# Patient Record
Sex: Male | Born: 1955 | Race: White | Hispanic: No | Marital: Married | Smoking: Former smoker
Health system: Southern US, Community
[De-identification: ages and names within clinical notes are randomized; demographics above are authoritative.]

## PROBLEM LIST (undated history)

## (undated) DIAGNOSIS — I10 Essential (primary) hypertension: Secondary | ICD-10-CM

## (undated) DIAGNOSIS — Z9289 Personal history of other medical treatment: Secondary | ICD-10-CM

## (undated) DIAGNOSIS — K635 Polyp of colon: Secondary | ICD-10-CM

## (undated) DIAGNOSIS — K219 Gastro-esophageal reflux disease without esophagitis: Secondary | ICD-10-CM

## (undated) DIAGNOSIS — B1920 Unspecified viral hepatitis C without hepatic coma: Secondary | ICD-10-CM

## (undated) DIAGNOSIS — J45909 Unspecified asthma, uncomplicated: Secondary | ICD-10-CM

## (undated) DIAGNOSIS — C3490 Malignant neoplasm of unspecified part of unspecified bronchus or lung: Secondary | ICD-10-CM

## (undated) DIAGNOSIS — K5730 Diverticulosis of large intestine without perforation or abscess without bleeding: Secondary | ICD-10-CM

## (undated) HISTORY — DX: Polyp of colon: K63.5

## (undated) HISTORY — DX: Unspecified asthma, uncomplicated: J45.909

## (undated) HISTORY — DX: Essential (primary) hypertension: I10

## (undated) HISTORY — DX: Unspecified viral hepatitis C without hepatic coma: B19.20

## (undated) HISTORY — DX: Personal history of other medical treatment: Z92.89

## (undated) HISTORY — DX: Diverticulosis of large intestine without perforation or abscess without bleeding: K57.30

## (undated) HISTORY — PX: FOREARM SURGERY: SHX651

## (undated) HISTORY — DX: Malignant neoplasm of unspecified part of unspecified bronchus or lung: C34.90

## (undated) HISTORY — DX: Gastro-esophageal reflux disease without esophagitis: K21.9

---

## 2004-12-06 HISTORY — PX: HIP SURGERY: SHX245

## 2006-06-28 ENCOUNTER — Emergency Department (HOSPITAL_COMMUNITY): Admission: EM | Admit: 2006-06-28 | Discharge: 2006-06-28 | Payer: Self-pay | Admitting: Emergency Medicine

## 2006-10-22 ENCOUNTER — Encounter: Admission: RE | Admit: 2006-10-22 | Discharge: 2006-10-22 | Payer: Self-pay | Admitting: *Deleted

## 2006-11-04 ENCOUNTER — Ambulatory Visit: Payer: Self-pay | Admitting: Emergency Medicine

## 2006-11-24 ENCOUNTER — Inpatient Hospital Stay (HOSPITAL_COMMUNITY): Admission: RE | Admit: 2006-11-24 | Discharge: 2006-11-28 | Payer: Self-pay | Admitting: Thoracic Surgery

## 2006-11-24 ENCOUNTER — Encounter (INDEPENDENT_AMBULATORY_CARE_PROVIDER_SITE_OTHER): Payer: Self-pay | Admitting: *Deleted

## 2006-11-24 ENCOUNTER — Ambulatory Visit: Payer: Self-pay | Admitting: Pulmonary Disease

## 2006-11-24 HISTORY — PX: LUNG LOBECTOMY: SHX167

## 2006-11-30 ENCOUNTER — Encounter: Admission: RE | Admit: 2006-11-30 | Discharge: 2006-11-30 | Payer: Self-pay | Admitting: Thoracic Surgery

## 2006-12-14 ENCOUNTER — Encounter: Payer: Self-pay | Admitting: Family Medicine

## 2006-12-14 ENCOUNTER — Encounter: Admission: RE | Admit: 2006-12-14 | Discharge: 2006-12-14 | Payer: Self-pay | Admitting: Thoracic Surgery

## 2007-01-04 ENCOUNTER — Ambulatory Visit: Payer: Self-pay | Admitting: Family Medicine

## 2007-01-04 DIAGNOSIS — Z85118 Personal history of other malignant neoplasm of bronchus and lung: Secondary | ICD-10-CM

## 2007-01-04 DIAGNOSIS — B182 Chronic viral hepatitis C: Secondary | ICD-10-CM | POA: Insufficient documentation

## 2007-01-04 DIAGNOSIS — D126 Benign neoplasm of colon, unspecified: Secondary | ICD-10-CM | POA: Insufficient documentation

## 2007-01-05 ENCOUNTER — Encounter: Payer: Self-pay | Admitting: Family Medicine

## 2007-01-06 LAB — CONVERTED CEMR LAB
ALT: 68 units/L — ABNORMAL HIGH (ref 0–53)
Albumin: 4.4 g/dL (ref 3.5–5.2)
Basophils Absolute: 0.1 10*3/uL (ref 0.0–0.1)
Basophils Relative: 1 % (ref 0–1)
Cholesterol: 158 mg/dL (ref 0–200)
Eosinophils Absolute: 0.3 10*3/uL (ref 0.0–0.7)
HCT: 45.2 % (ref 39.0–52.0)
HCV Ab: POSITIVE — AB
HDL: 39 mg/dL — ABNORMAL LOW (ref >39)
LDL Cholesterol: 105 mg/dL — ABNORMAL HIGH (ref 0–99)
Lymphs Abs: 1.9 10*3/uL (ref 0.7–3.3)
Monocytes Absolute: 0.4 10*3/uL (ref 0.2–0.7)
Monocytes Relative: 8 % (ref 3–11)
Neutrophils Relative %: 50 % (ref 43–77)
RBC: 5.11 M/uL (ref 4.22–5.81)
Sodium: 140 meq/L (ref 135–145)
Total Protein: 7.4 g/dL (ref 6.0–8.3)
Triglycerides: 70 mg/dL (ref <150)

## 2007-01-12 ENCOUNTER — Telehealth: Payer: Self-pay | Admitting: Family Medicine

## 2007-01-20 ENCOUNTER — Ambulatory Visit: Payer: Self-pay | Admitting: Gastroenterology

## 2007-01-27 ENCOUNTER — Telehealth: Payer: Self-pay | Admitting: Family Medicine

## 2007-02-01 ENCOUNTER — Encounter: Admission: RE | Admit: 2007-02-01 | Discharge: 2007-02-01 | Payer: Self-pay | Admitting: Thoracic Surgery

## 2007-02-01 ENCOUNTER — Ambulatory Visit: Payer: Self-pay | Admitting: Thoracic Surgery

## 2007-03-07 ENCOUNTER — Ambulatory Visit: Payer: Self-pay | Admitting: Gastroenterology

## 2007-03-24 ENCOUNTER — Telehealth (INDEPENDENT_AMBULATORY_CARE_PROVIDER_SITE_OTHER): Payer: Self-pay | Admitting: *Deleted

## 2007-04-04 ENCOUNTER — Telehealth: Payer: Self-pay | Admitting: Family Medicine

## 2007-04-05 ENCOUNTER — Ambulatory Visit: Payer: Self-pay | Admitting: Family Medicine

## 2007-04-05 DIAGNOSIS — L659 Nonscarring hair loss, unspecified: Secondary | ICD-10-CM | POA: Insufficient documentation

## 2007-04-26 ENCOUNTER — Ambulatory Visit: Payer: Self-pay | Admitting: Thoracic Surgery

## 2007-04-26 ENCOUNTER — Encounter: Admission: RE | Admit: 2007-04-26 | Discharge: 2007-04-26 | Payer: Self-pay | Admitting: Thoracic Surgery

## 2007-05-04 ENCOUNTER — Ambulatory Visit: Payer: Self-pay | Admitting: Family Medicine

## 2007-05-04 DIAGNOSIS — E291 Testicular hypofunction: Secondary | ICD-10-CM | POA: Insufficient documentation

## 2007-05-05 ENCOUNTER — Telehealth (INDEPENDENT_AMBULATORY_CARE_PROVIDER_SITE_OTHER): Payer: Self-pay | Admitting: *Deleted

## 2007-05-08 LAB — CONVERTED CEMR LAB: Testosterone Free: 672.6 pg/mL — ABNORMAL HIGH (ref 47.0–244.0)

## 2007-05-12 ENCOUNTER — Ambulatory Visit (HOSPITAL_COMMUNITY): Admission: RE | Admit: 2007-05-12 | Discharge: 2007-05-12 | Payer: Self-pay | Admitting: Gastroenterology

## 2007-05-12 ENCOUNTER — Encounter (INDEPENDENT_AMBULATORY_CARE_PROVIDER_SITE_OTHER): Payer: Self-pay | Admitting: Interventional Radiology

## 2007-06-08 ENCOUNTER — Encounter: Payer: Self-pay | Admitting: Family Medicine

## 2007-07-17 ENCOUNTER — Ambulatory Visit: Payer: Self-pay | Admitting: Family Medicine

## 2007-07-17 ENCOUNTER — Telehealth (INDEPENDENT_AMBULATORY_CARE_PROVIDER_SITE_OTHER): Payer: Self-pay | Admitting: *Deleted

## 2007-07-17 ENCOUNTER — Encounter: Admission: RE | Admit: 2007-07-17 | Discharge: 2007-07-17 | Payer: Self-pay | Admitting: Family Medicine

## 2007-07-18 ENCOUNTER — Encounter: Payer: Self-pay | Admitting: Family Medicine

## 2007-07-18 ENCOUNTER — Telehealth (INDEPENDENT_AMBULATORY_CARE_PROVIDER_SITE_OTHER): Payer: Self-pay | Admitting: *Deleted

## 2007-07-18 ENCOUNTER — Telehealth: Payer: Self-pay | Admitting: Family Medicine

## 2007-07-18 ENCOUNTER — Encounter: Admission: RE | Admit: 2007-07-18 | Discharge: 2007-07-18 | Payer: Self-pay | Admitting: Family Medicine

## 2007-07-19 ENCOUNTER — Ambulatory Visit: Payer: Self-pay | Admitting: Thoracic Surgery

## 2007-07-21 ENCOUNTER — Encounter: Payer: Self-pay | Admitting: Family Medicine

## 2007-07-24 ENCOUNTER — Telehealth: Payer: Self-pay | Admitting: Family Medicine

## 2007-07-24 ENCOUNTER — Encounter: Payer: Self-pay | Admitting: Family Medicine

## 2007-08-10 ENCOUNTER — Ambulatory Visit: Payer: Self-pay | Admitting: Gastroenterology

## 2007-08-23 IMAGING — CR DG CHEST 2V
2 series · 2 of 2 positions shown · non-contrast
Comparison: 11/30/06

CLINICAL DATA: Lung lesion.   Post operative lung surgery. 
 CHEST - 2 VIEW:

[view not recorded (1 of 2)]
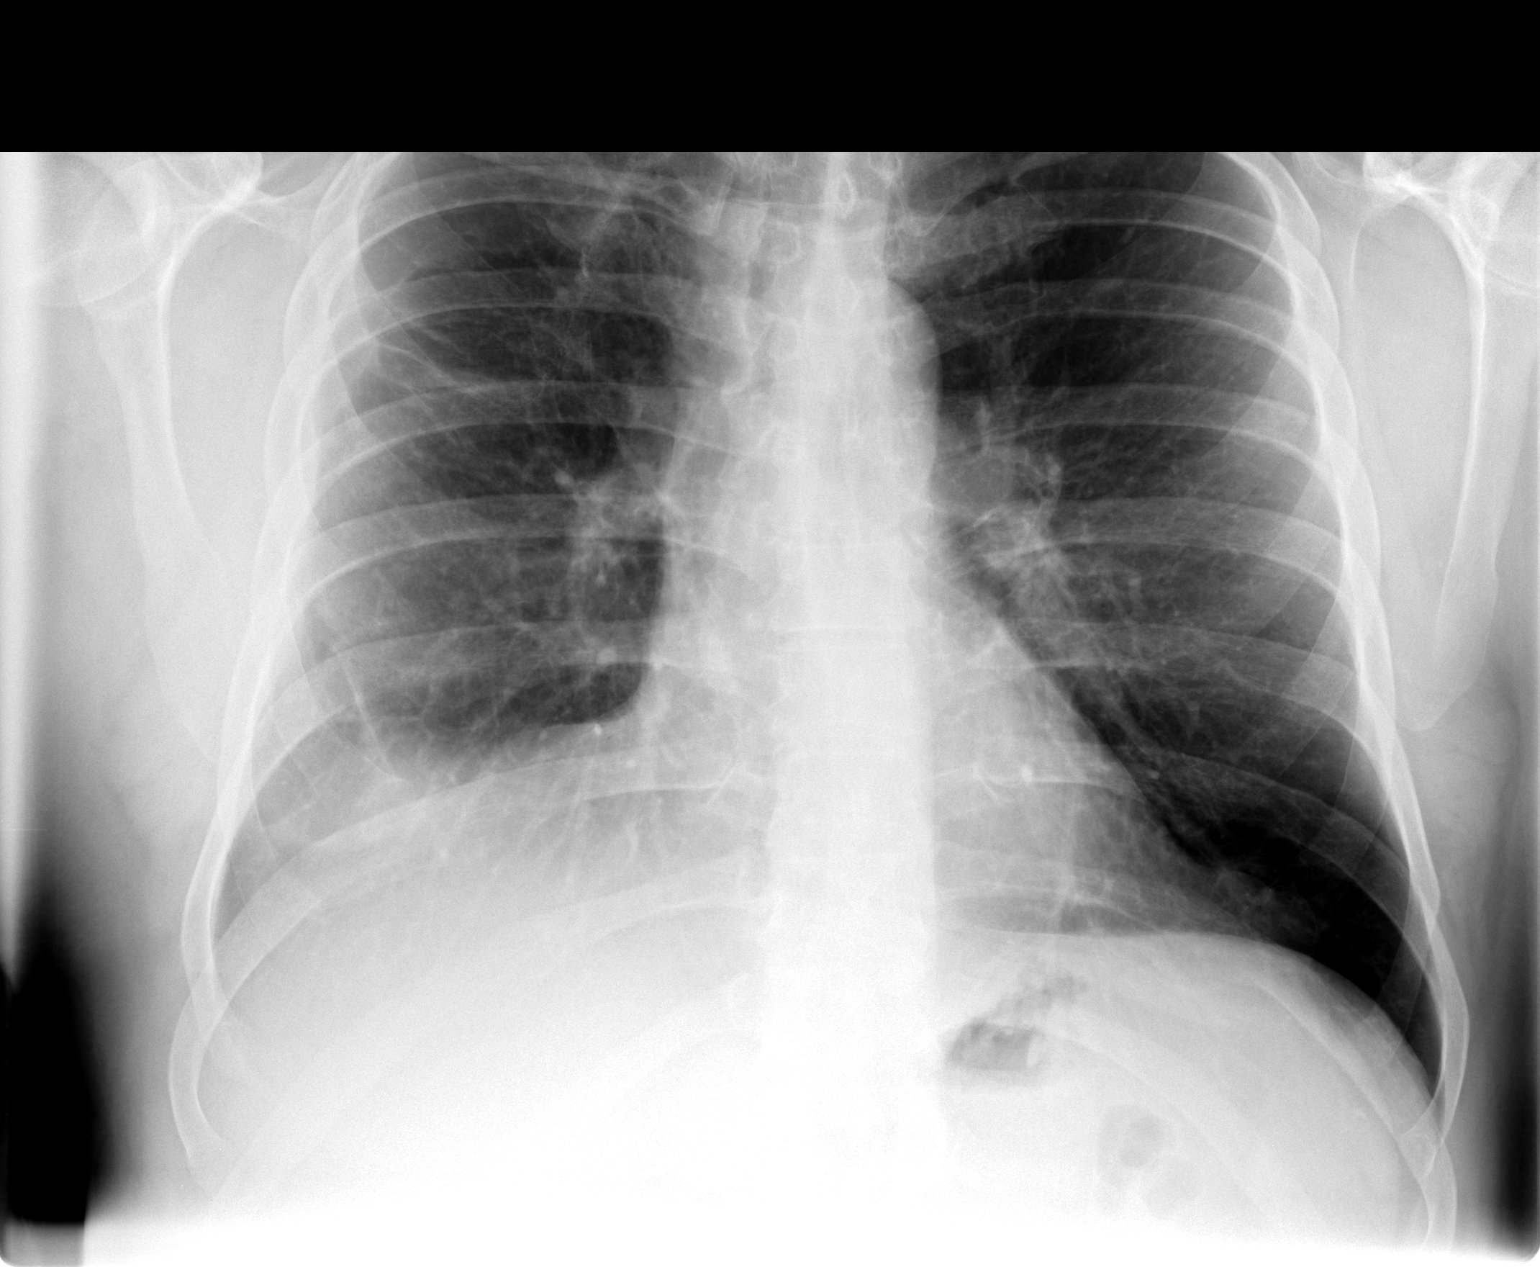

[view not recorded (2 of 2)]
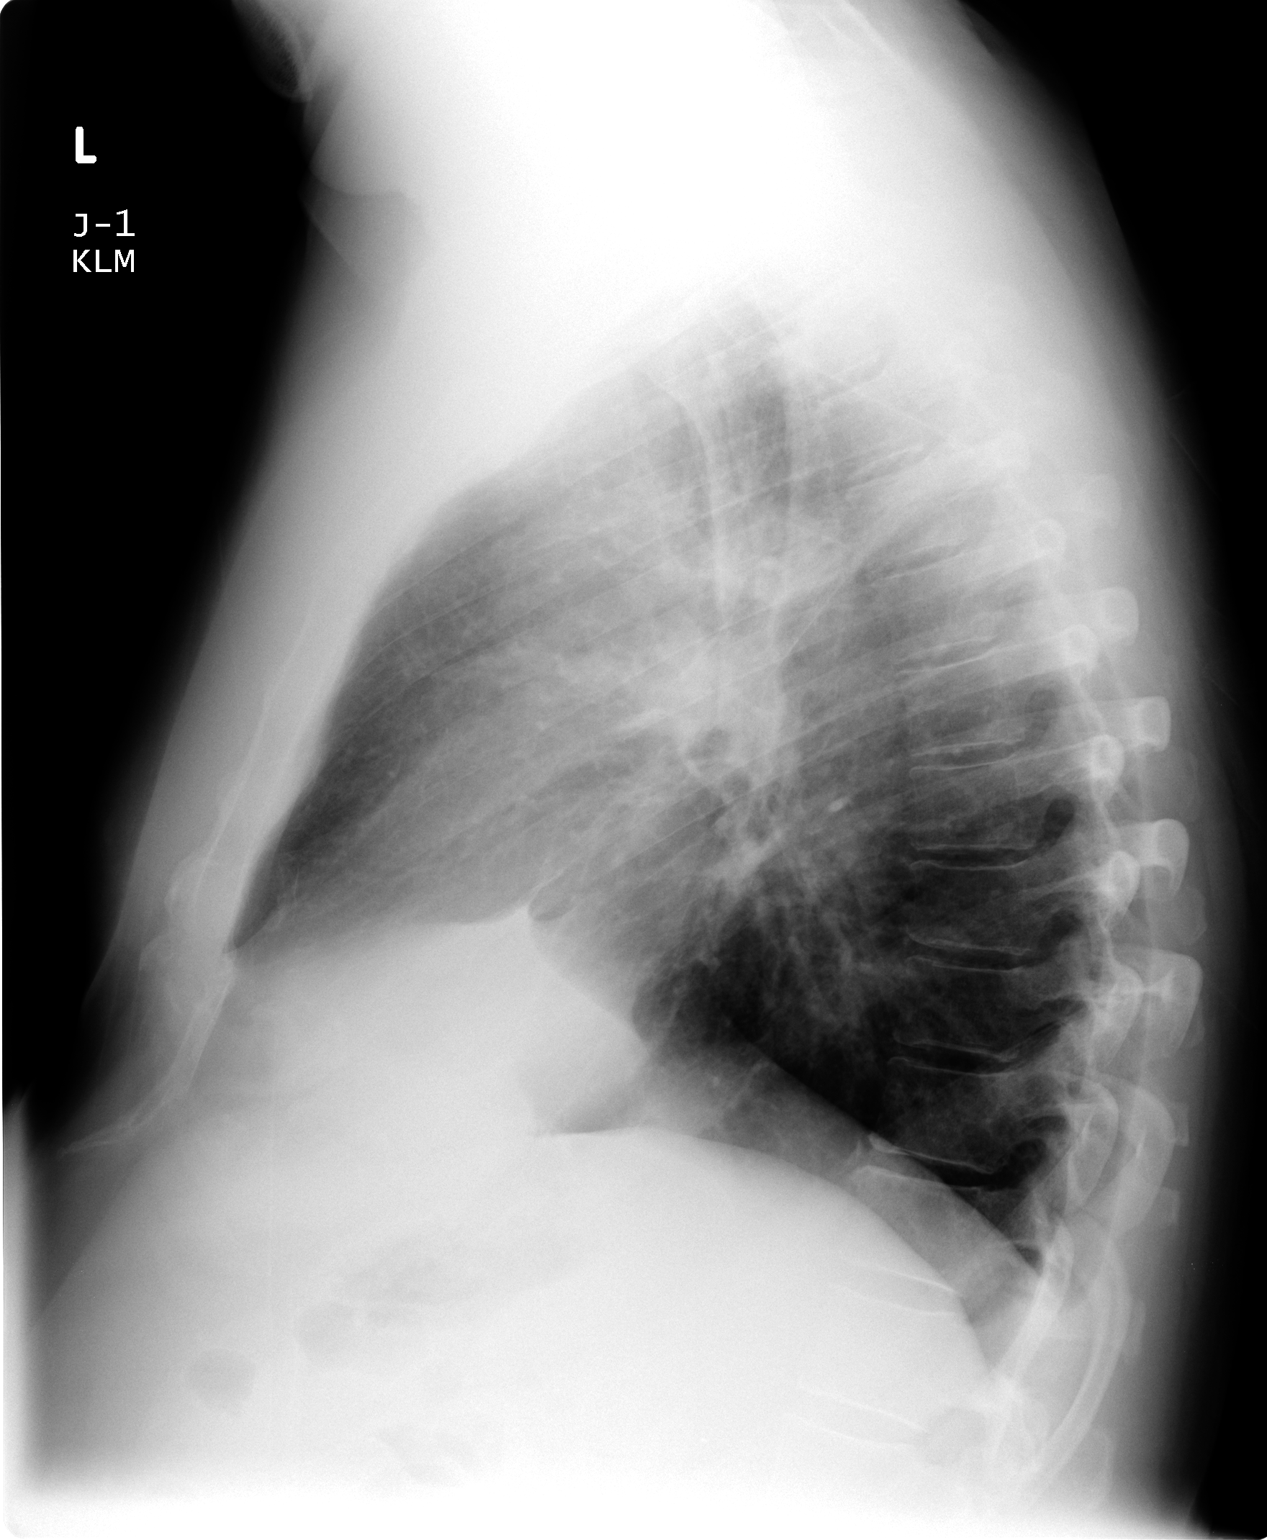

[2 of 2 positions shown; findings below may reference images not displayed]

FINDINGS: Post op changes on the right with pleural and parenchymal scarring on the right, improved from the prior study.  No infiltrate, effusion or mass is identified.
IMPRESSION: Post op change on the right.  No acute abnormality.

## 2007-08-30 ENCOUNTER — Ambulatory Visit: Payer: Self-pay | Admitting: Family Medicine

## 2007-08-30 DIAGNOSIS — S2249XA Multiple fractures of ribs, unspecified side, initial encounter for closed fracture: Secondary | ICD-10-CM

## 2007-09-06 ENCOUNTER — Telehealth: Payer: Self-pay | Admitting: Family Medicine

## 2007-09-26 ENCOUNTER — Ambulatory Visit: Payer: Self-pay | Admitting: Family Medicine

## 2007-10-26 ENCOUNTER — Ambulatory Visit: Payer: Self-pay | Admitting: Thoracic Surgery

## 2007-10-26 ENCOUNTER — Encounter: Admission: RE | Admit: 2007-10-26 | Discharge: 2007-10-26 | Payer: Self-pay | Admitting: Thoracic Surgery

## 2008-01-23 ENCOUNTER — Ambulatory Visit: Payer: Self-pay | Admitting: Gastroenterology

## 2008-04-18 ENCOUNTER — Ambulatory Visit: Payer: Self-pay | Admitting: Family Medicine

## 2008-04-18 DIAGNOSIS — M169 Osteoarthritis of hip, unspecified: Secondary | ICD-10-CM

## 2008-04-18 DIAGNOSIS — F909 Attention-deficit hyperactivity disorder, unspecified type: Secondary | ICD-10-CM | POA: Insufficient documentation

## 2008-04-18 DIAGNOSIS — M1610 Unilateral primary osteoarthritis, unspecified hip: Secondary | ICD-10-CM | POA: Insufficient documentation

## 2008-04-19 ENCOUNTER — Encounter: Payer: Self-pay | Admitting: Family Medicine

## 2008-04-19 LAB — CONVERTED CEMR LAB
ALT: 89 units/L — ABNORMAL HIGH (ref 0–53)
AST: 55 units/L — ABNORMAL HIGH (ref 0–37)
Alkaline Phosphatase: 54 units/L (ref 39–117)
CO2: 23 meq/L (ref 19–32)
Glucose, Bld: 105 mg/dL — ABNORMAL HIGH (ref 70–99)
HDL: 39 mg/dL — ABNORMAL LOW (ref >39)
Total CHOL/HDL Ratio: 4.5
Total Protein: 7.8 g/dL (ref 6.0–8.3)
VLDL: 16 mg/dL (ref 0–40)

## 2008-04-23 ENCOUNTER — Encounter: Payer: Self-pay | Admitting: Family Medicine

## 2008-04-24 ENCOUNTER — Encounter: Payer: Self-pay | Admitting: Family Medicine

## 2008-04-25 ENCOUNTER — Ambulatory Visit: Payer: Self-pay | Admitting: Thoracic Surgery

## 2008-04-25 ENCOUNTER — Encounter: Admission: RE | Admit: 2008-04-25 | Discharge: 2008-04-25 | Payer: Self-pay | Admitting: Thoracic Surgery

## 2008-05-06 ENCOUNTER — Encounter: Payer: Self-pay | Admitting: Family Medicine

## 2008-06-24 ENCOUNTER — Telehealth: Payer: Self-pay | Admitting: Family Medicine

## 2008-07-17 ENCOUNTER — Ambulatory Visit: Payer: Self-pay | Admitting: Family Medicine

## 2008-07-17 ENCOUNTER — Telehealth (INDEPENDENT_AMBULATORY_CARE_PROVIDER_SITE_OTHER): Payer: Self-pay | Admitting: *Deleted

## 2008-07-17 DIAGNOSIS — E348 Other specified endocrine disorders: Secondary | ICD-10-CM | POA: Insufficient documentation

## 2008-07-18 LAB — CONVERTED CEMR LAB
AST: 58 units/L — ABNORMAL HIGH (ref 0–37)
Albumin: 4.6 g/dL (ref 3.5–5.2)
Alkaline Phosphatase: 55 units/L (ref 39–117)
Total Protein: 8.1 g/dL (ref 6.0–8.3)

## 2008-08-19 ENCOUNTER — Telehealth: Payer: Self-pay | Admitting: Family Medicine

## 2008-08-26 ENCOUNTER — Ambulatory Visit: Payer: Self-pay | Admitting: Family Medicine

## 2008-11-22 ENCOUNTER — Ambulatory Visit: Payer: Self-pay | Admitting: Family Medicine

## 2008-11-22 DIAGNOSIS — R030 Elevated blood-pressure reading, without diagnosis of hypertension: Secondary | ICD-10-CM

## 2008-11-25 ENCOUNTER — Telehealth: Payer: Self-pay | Admitting: Family Medicine

## 2008-12-16 ENCOUNTER — Ambulatory Visit: Payer: Self-pay | Admitting: Family Medicine

## 2008-12-17 ENCOUNTER — Telehealth: Payer: Self-pay | Admitting: Family Medicine

## 2008-12-25 ENCOUNTER — Encounter: Payer: Self-pay | Admitting: Family Medicine

## 2008-12-25 ENCOUNTER — Ambulatory Visit: Payer: Self-pay | Admitting: Cardiology

## 2009-01-01 ENCOUNTER — Ambulatory Visit: Payer: Self-pay

## 2009-01-01 ENCOUNTER — Ambulatory Visit: Payer: Self-pay | Admitting: Cardiology

## 2009-01-01 ENCOUNTER — Encounter: Payer: Self-pay | Admitting: Family Medicine

## 2009-01-01 LAB — CONVERTED CEMR LAB
BUN: 26 mg/dL — ABNORMAL HIGH (ref 6–23)
Chloride: 105 meq/L (ref 96–112)
GFR calc Af Amer: 90 mL/min
Potassium: 4.5 meq/L (ref 3.5–5.1)
Sodium: 140 meq/L (ref 135–145)

## 2009-01-31 ENCOUNTER — Telehealth: Payer: Self-pay | Admitting: Family Medicine

## 2009-03-17 ENCOUNTER — Telehealth (INDEPENDENT_AMBULATORY_CARE_PROVIDER_SITE_OTHER): Payer: Self-pay | Admitting: *Deleted

## 2009-03-22 DIAGNOSIS — R0609 Other forms of dyspnea: Secondary | ICD-10-CM

## 2009-03-22 DIAGNOSIS — B1910 Unspecified viral hepatitis B without hepatic coma: Secondary | ICD-10-CM | POA: Insufficient documentation

## 2009-03-22 DIAGNOSIS — F17200 Nicotine dependence, unspecified, uncomplicated: Secondary | ICD-10-CM

## 2009-03-22 DIAGNOSIS — R0989 Other specified symptoms and signs involving the circulatory and respiratory systems: Secondary | ICD-10-CM

## 2009-03-22 DIAGNOSIS — B1710 Acute hepatitis C without hepatic coma: Secondary | ICD-10-CM

## 2009-03-22 DIAGNOSIS — I10 Essential (primary) hypertension: Secondary | ICD-10-CM

## 2009-03-22 DIAGNOSIS — E785 Hyperlipidemia, unspecified: Secondary | ICD-10-CM | POA: Insufficient documentation

## 2009-12-24 ENCOUNTER — Encounter: Payer: Self-pay | Admitting: Cardiology

## 2009-12-24 ENCOUNTER — Ambulatory Visit: Payer: Self-pay | Admitting: Cardiology

## 2009-12-29 ENCOUNTER — Telehealth: Payer: Self-pay | Admitting: Family Medicine

## 2010-01-01 ENCOUNTER — Encounter: Payer: Self-pay | Admitting: Family Medicine

## 2010-01-01 LAB — CONVERTED CEMR LAB
BUN: 33 mg/dL — ABNORMAL HIGH (ref 6–23)
CO2: 22 meq/L (ref 19–32)
Cholesterol: 182 mg/dL (ref 0–200)
Creatinine, Ser: 1.05 mg/dL (ref 0.40–1.50)
Glucose, Bld: 92 mg/dL (ref 70–99)
HCT: 46.6 % (ref 39.0–52.0)
HDL: 34 mg/dL — ABNORMAL LOW (ref >39)
Hemoglobin: 16.1 g/dL (ref 13.0–17.0)
MCHC: 34.5 g/dL (ref 30.0–36.0)
MCV: 82 fL (ref 78.0–100.0)
RBC: 5.68 M/uL (ref 4.22–5.81)
Sodium: 140 meq/L (ref 135–145)
Total Bilirubin: 0.4 mg/dL (ref 0.3–1.2)
Total Protein: 7.4 g/dL (ref 6.0–8.3)
Triglycerides: 108 mg/dL (ref <150)
VLDL: 22 mg/dL (ref 0–40)
WBC: 5.8 10*3/uL (ref 4.0–10.5)

## 2010-01-07 ENCOUNTER — Ambulatory Visit: Payer: Self-pay | Admitting: Family Medicine

## 2010-01-07 DIAGNOSIS — R6882 Decreased libido: Secondary | ICD-10-CM | POA: Insufficient documentation

## 2010-01-07 DIAGNOSIS — R3914 Feeling of incomplete bladder emptying: Secondary | ICD-10-CM

## 2010-01-07 DIAGNOSIS — N401 Enlarged prostate with lower urinary tract symptoms: Secondary | ICD-10-CM | POA: Insufficient documentation

## 2010-01-09 LAB — CONVERTED CEMR LAB: Testosterone: 332.94 ng/dL — ABNORMAL LOW (ref 350–890)

## 2010-01-12 ENCOUNTER — Telehealth: Payer: Self-pay | Admitting: Family Medicine

## 2010-01-12 ENCOUNTER — Ambulatory Visit: Payer: Self-pay | Admitting: Family Medicine

## 2010-01-12 DIAGNOSIS — J209 Acute bronchitis, unspecified: Secondary | ICD-10-CM

## 2010-01-27 ENCOUNTER — Ambulatory Visit: Payer: Self-pay | Admitting: Family Medicine

## 2010-02-03 ENCOUNTER — Telehealth: Payer: Self-pay | Admitting: Family Medicine

## 2010-02-10 ENCOUNTER — Ambulatory Visit: Payer: Self-pay | Admitting: Family Medicine

## 2010-02-10 ENCOUNTER — Encounter: Admission: RE | Admit: 2010-02-10 | Discharge: 2010-02-10 | Payer: Self-pay | Admitting: Thoracic Surgery

## 2010-02-10 ENCOUNTER — Ambulatory Visit: Payer: Self-pay | Admitting: Thoracic Surgery

## 2010-02-23 ENCOUNTER — Telehealth: Payer: Self-pay | Admitting: Cardiology

## 2010-02-24 ENCOUNTER — Ambulatory Visit: Payer: Self-pay | Admitting: Family Medicine

## 2010-02-24 DIAGNOSIS — I951 Orthostatic hypotension: Secondary | ICD-10-CM

## 2010-03-03 ENCOUNTER — Ambulatory Visit: Payer: Self-pay | Admitting: Family Medicine

## 2010-03-03 DIAGNOSIS — R42 Dizziness and giddiness: Secondary | ICD-10-CM

## 2010-03-04 LAB — CONVERTED CEMR LAB
BUN: 21 mg/dL (ref 6–23)
CO2: 18 meq/L — ABNORMAL LOW (ref 19–32)
Chloride: 104 meq/L (ref 96–112)
Creatinine, Ser: 1.13 mg/dL (ref 0.40–1.50)
HCT: 48.7 % (ref 39.0–52.0)
Hemoglobin: 16.2 g/dL (ref 13.0–17.0)
MCV: 86.5 fL (ref 78.0–100.0)
RDW: 14.7 % (ref 11.5–15.5)

## 2010-03-09 ENCOUNTER — Telehealth: Payer: Self-pay | Admitting: Family Medicine

## 2010-03-10 ENCOUNTER — Ambulatory Visit: Payer: Self-pay | Admitting: Family Medicine

## 2010-03-24 ENCOUNTER — Ambulatory Visit: Payer: Self-pay | Admitting: Family Medicine

## 2010-03-24 ENCOUNTER — Telehealth (INDEPENDENT_AMBULATORY_CARE_PROVIDER_SITE_OTHER): Payer: Self-pay | Admitting: *Deleted

## 2010-04-07 ENCOUNTER — Ambulatory Visit: Payer: Self-pay | Admitting: Family Medicine

## 2010-04-07 ENCOUNTER — Telehealth (INDEPENDENT_AMBULATORY_CARE_PROVIDER_SITE_OTHER): Payer: Self-pay | Admitting: *Deleted

## 2010-04-07 DIAGNOSIS — H8110 Benign paroxysmal vertigo, unspecified ear: Secondary | ICD-10-CM | POA: Insufficient documentation

## 2010-04-07 LAB — CONVERTED CEMR LAB
HCT: 51.3 % (ref 39.0–52.0)
PSA: 0.41 ng/mL (ref 0.10–4.00)

## 2010-04-09 ENCOUNTER — Telehealth: Payer: Self-pay | Admitting: Family Medicine

## 2010-04-09 ENCOUNTER — Ambulatory Visit: Payer: Self-pay | Admitting: Family Medicine

## 2010-04-21 ENCOUNTER — Ambulatory Visit: Payer: Self-pay | Admitting: Family Medicine

## 2010-05-05 ENCOUNTER — Ambulatory Visit: Payer: Self-pay | Admitting: Family Medicine

## 2010-05-19 ENCOUNTER — Ambulatory Visit: Payer: Self-pay | Admitting: Family Medicine

## 2010-06-02 ENCOUNTER — Ambulatory Visit: Payer: Self-pay | Admitting: Family Medicine

## 2010-06-16 ENCOUNTER — Ambulatory Visit: Payer: Self-pay | Admitting: Family Medicine

## 2010-06-29 ENCOUNTER — Telehealth (INDEPENDENT_AMBULATORY_CARE_PROVIDER_SITE_OTHER): Payer: Self-pay | Admitting: *Deleted

## 2010-06-30 ENCOUNTER — Ambulatory Visit: Payer: Self-pay | Admitting: Family Medicine

## 2010-06-30 ENCOUNTER — Telehealth (INDEPENDENT_AMBULATORY_CARE_PROVIDER_SITE_OTHER): Payer: Self-pay | Admitting: *Deleted

## 2010-07-01 LAB — CONVERTED CEMR LAB
AST: 39 units/L — ABNORMAL HIGH (ref 0–37)
PSA: 0.49 ng/mL (ref 0.10–4.00)
Testosterone: 594.67 ng/dL (ref 350–890)

## 2010-07-14 ENCOUNTER — Ambulatory Visit: Payer: Self-pay | Admitting: Family Medicine

## 2010-07-29 ENCOUNTER — Ambulatory Visit: Payer: Self-pay | Admitting: Family Medicine

## 2010-08-05 ENCOUNTER — Telehealth (INDEPENDENT_AMBULATORY_CARE_PROVIDER_SITE_OTHER): Payer: Self-pay | Admitting: *Deleted

## 2010-08-05 ENCOUNTER — Telehealth: Payer: Self-pay | Admitting: Family Medicine

## 2010-08-06 ENCOUNTER — Encounter: Payer: Self-pay | Admitting: Family Medicine

## 2010-08-07 ENCOUNTER — Telehealth: Payer: Self-pay | Admitting: Family Medicine

## 2010-08-31 ENCOUNTER — Telehealth: Payer: Self-pay | Admitting: Family Medicine

## 2010-09-04 ENCOUNTER — Telehealth: Payer: Self-pay | Admitting: Family Medicine

## 2010-09-09 ENCOUNTER — Telehealth: Payer: Self-pay | Admitting: Family Medicine

## 2010-09-15 ENCOUNTER — Ambulatory Visit: Payer: Self-pay | Admitting: Family Medicine

## 2010-10-14 ENCOUNTER — Telehealth: Payer: Self-pay | Admitting: Family Medicine

## 2010-12-16 ENCOUNTER — Telehealth: Payer: Self-pay | Admitting: Family Medicine

## 2010-12-16 ENCOUNTER — Encounter: Payer: Self-pay | Admitting: Gastroenterology

## 2010-12-27 ENCOUNTER — Encounter: Payer: Self-pay | Admitting: Thoracic Surgery

## 2011-01-05 NOTE — Assessment & Plan Note (Signed)
Summary: testosterone inj  Nurse Visit   Vitals Entered By: [name] (May 05, 2010 9:26 AM)  Allergies: 1)  ! 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)    Route: IM    Site: LUOQ gluteus    Patient tolerated injection without complications    Given by: [name] (May 05, 2010 9:27 AM)  Orders Added: 1)  Testosterone Cypionat 200mg  ing [J1080] 2)  Admin of Therapeutic Inj  intramuscular or subcutaneous [96372]   Medication Administration  Injection # 1:    Medication: Testosterone Cypionat 200mg  ing    Diagnosis: HYPOGONADISM, MALE (ICD-257.2)    Route: IM    Site: LUOQ gluteus    Patient tolerated injection without complications    Given by: [name] (May 05, 2010 9:27 AM)  Orders Added: 1)  Testosterone Cypionat 200mg  ing [J1080] 2)  Admin of Therapeutic Inj  intramuscular or subcutaneous [[id number]]

## 2011-01-05 NOTE — Assessment & Plan Note (Signed)
Summary: testosterone  Nurse Visit   Vitals Entered By: [name] (July 14, 2010 9:23 AM)  Allergies (verified): 1)  ! 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)    Route: IM    Site: LUOQ gluteus    Exp Date: 10/2012    Lot #: [id number]    Patient tolerated injection without complications    Given by: [name] (July 14, 2010 9:58 AM)  Orders Added: 1)  Testosterone Cypionat 200mg  ing [J1080] 2)  Admin of Therapeutic Inj  intramuscular or subcutaneous [96372]   Medication Administration  Injection # 1:    Medication: Testosterone Cypionat 200mg  ing    Diagnosis: HYPOGONADISM, MALE (ICD-257.2)    Route: IM    Site: LUOQ gluteus    Exp Date: 10/2012    Lot #: [id number]    Patient tolerated injection without complications    Given by: [name] (July 14, 2010 9:58 AM)  Orders Added: 1)  Testosterone Cypionat 200mg  ing [J1080] 2)  Admin of Therapeutic Inj  intramuscular or subcutaneous [[id number]]

## 2011-01-05 NOTE — Progress Notes (Signed)
Summary: FYI  Phone Note Call from Patient   Caller: Spouse Summary of Call: FYI- Wife called just to inform you that they have been having marital problems for quite a while. They are trying to move forward and work things out.  Initial call taken by: [name],  Apr 07, 2010 2:13 PM

## 2011-01-05 NOTE — Progress Notes (Signed)
Summary: Lab order  Phone Note Call from Patient   Caller: Patient Summary of Call: Pt coming in tomorrow for F/U OV. Pt would like to have blood drawn before OV. Please order labs and I will fax to pharm. Initial call taken by: [name],  September 09, 2010 3:41 PM

## 2011-01-05 NOTE — Assessment & Plan Note (Signed)
Summary: TEST. 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.2)    Route: IM    Site: RUOQ gluteus    Patient tolerated injection without complications    Given by: [name] (January 27, 2010 9:15 AM)  Orders Added: 1)  Testosterone Cypionat 200mg  ing [J1080] 2)  Admin of Therapeutic Inj  intramuscular or subcutaneous [96372]   Medication Administration  Injection # 1:    Medication: Testosterone Cypionat 200mg  ing    Diagnosis: HYPOGONADISM, MALE (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

## 2011-01-05 NOTE — Progress Notes (Signed)
Summary: Strattera SE  Phone Note Call from Patient   Caller: Patient Summary of Call: Pt states Strattera made him very sick. He does not want to continue it. He does want to try something else and wanted to know if you have samples of anything for him to try. Please advise. Initial call taken by: [name],  Apr 09, 2010 4:32 PM  Follow-up for Phone Call        I don't have any samples of other meds. Can you be more specific with the SEs.   Will consider a new start of a different med next wk. Follow-up by: [name] DO,  Apr 09, 2010 4:46 PM     Appended Document: Strattera SE He states he had nausea, vomiting, weakness, and dizziness.   Appended Document: Strattera SE Pt can use OTC Pepto Bismol for GI upset with benadryl tonight. Stop Strattera.  i've added it to his allergy list. Have him call me Monday and let me know if he is feeling better.  [name], D.O.  Appended Document: Strattera SE Pt was already feeling better when he came in for nurse visit.

## 2011-01-05 NOTE — Assessment & Plan Note (Signed)
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.2)    Route: IM    Site: LUOQ gluteus    Patient tolerated injection without complications    Given by: [name] (March 10, 2010 9:37 AM)  Orders Added: 1)  Testosterone Cypionat 200mg  ing [J1080] 2)  Admin of Therapeutic Inj  intramuscular or subcutaneous [96372]   Medication Administration  Injection # 1:    Medication: Testosterone Cypionat 200mg  ing    Diagnosis: HYPOGONADISM, MALE (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

## 2011-01-05 NOTE — Progress Notes (Signed)
Summary: Adderall to expensive  Phone Note Call from Patient   Caller: Patient Summary of Call: The generic adderall is still $40/month and he is unable to afford it. Pt would like to know if you have any cheaper alternatives. Please advise. Initial call taken by: [name],  June 30, 2010 12:41 PM  Follow-up for Phone Call        even for short acting generic adderall, the cost is $27 for just 20 pills.  There really is not anything cheaper than that. Follow-up by: [name] DO,  June 30, 2010 12:44 PM  Additional Follow-up for Phone Call Additional follow up Details #1::        Pt will check w/ his out pt pharm Additional Follow-up by: [name],  June 30, 2010 12:58 PM     Appended Document: Adderall to expensive Pt states he called his Rx company and they recommended Pt use [facility] out pt pharm bc it is cheaper but after current Rx is out Pt requests 90 day supply bc it is the cheapest option.   Appended Document: Adderall to expensive OK. Let's be sure that this is working well for him and BP looks good prior to getting a 90 day supply.  [name], D.O.  Appended Document: Adderall to expensive Pt aware

## 2011-01-05 NOTE — Progress Notes (Signed)
    Appended Document:  Additional labs added. [name], D.O.
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
## 2011-01-05 NOTE — Progress Notes (Signed)
Summary: Adderall refill  Phone Note Refill Request   Refills Requested: Medication #1:  ADDERALL XR 10 MG XR24H-CAP 1 capsule by mouth qAM Requesting 90 day Rx bc it is cheaper for Pt  Initial call taken by: [name],  September 04, 2010 3:25 PM    Prescriptions: ADDERALL XR 10 MG XR24H-CAP (AMPHETAMINE-DEXTROAMPHETAMINE) 1 capsule by mouth qAM  #90 x 0   Entered and Authorized by:   [name] DO   Signed by:   [name] DO on 09/04/2010   Method used:   Print then Give to Patient   RxID:   [id number]   Appended Document: Adderall refill Pt called back and stated that he was going to wait to pickup med bc he cant tell that it is even working. I told Pt that I would leave Rx at front desk in case he changed his mind.

## 2011-01-05 NOTE — Progress Notes (Signed)
Summary: Androgel ?  Phone Note Call from Patient   Caller: Patient Summary of Call: Pt wanted to be sure that it's OK for him to take metoprolol and use androgel at the same time.  Initial call taken by: [name],  August 07, 2010 9:50 AM  Follow-up for Phone Call        I confirmed that there is no drug interaction b/w Metoprolol and Androgel. Follow-up by: [name] DO,  August 07, 2010 10:00 AM     Appended Document: Androgel ? LMOM informing Pt of the above
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
## 2011-01-05 NOTE — Progress Notes (Signed)
Summary: Meds  Phone Note Call from Patient Call back at [facility] Phone [phone]   Caller: Patient Call For: [name] DO Summary of Call: Pt calls and states that he has been taking some old Amphetamine Salts that he had they re expired and are not working and wants to know if you would give him an rx for them on the 5mg  dose. Please let pt know Initial call taken by: Kathlene November LPN,  October 14, 2010 9:00 AM    New/Updated Medications: AMPHETAMINE-DEXTROAMPHETAMINE 5 MG TABS (AMPHETAMINE-DEXTROAMPHETAMINE) Take 1 tab by mouth two times a day Prescriptions: AMPHETAMINE-DEXTROAMPHETAMINE 5 MG TABS (AMPHETAMINE-DEXTROAMPHETAMINE) Take 1 tab by mouth two times a day  #60 x 0   Entered and Authorized by:   [name] DO   Signed by:   [name] DO on 10/14/2010   Method used:   Print then Give to Patient   RxID:   [id number]   Appended Document: Meds pt notified. KJ LPN

## 2011-01-05 NOTE — Assessment & Plan Note (Signed)
Summary: f/u ADD/ testosterone   Vital Signs:  Patient profile:   55 year old male Height:      71 inches Weight:      206 pounds BMI:     28.84 O2 Sat:      97 % on Room air Pulse rate:   76 / minute BP sitting:   114 / 71  (left arm) Cuff size:   large  Vitals Entered By: [name] (June 30, 2010 9:51 AM)  O2 Flow:  Room air CC: F/U.    Primary Care Provider:  [name] D.O.  CC:  F/U. Marland Kitchen  History of Present Illness: 55 yo WM presents for f/u ADHD.  He was started on Straterra starting dose pack last visit in May but had to stop it right away becuase it made him nauseated.  He had labs drawn for f/u testosterone.  He is getting injected q 2 wks.  Feels well for about 9 days and then notes that it wears out.  He feels better.  He would like to go back on Adderall for ADD.  In the past, he had to stop it, even though it seemed to help, because of bP elevation.  His BP has done great on Metoprolol.  Current Medications (verified): 1)  Multivitamins  Tabs (Multiple Vitamin) .... Take 1 Tablet By Mouth Once A Day 2)  Ventolin Hfa 108 (90 Base) Mcg/act Aers (Albuterol Sulfate) .... 2 Puffs 4 X A Day For The Next Wk Then Prn 3)  Metoprolol Succinate 25 Mg Xr24h-Tab (Metoprolol Succinate) .... Take 1 Tablet By Mouth Once A Day  Allergies (verified): 1)  ! Blase Mess 2)  Codeine  Past History:  Past Medical History: Reviewed history from 04/07/2010 and no changes required. 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 Spent time in prison, mulitple tattoos.  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  started smoking again and  still smokes cigars.  Denies ETOH.  Exercises.  Review of Systems      See HPI  Physical Exam  General:  alert, well-developed, well-nourished, and well-hydrated.   Head:  normocephalic and atraumatic.   Mouth:  pharynx pink and moist.   Neck:  no masses.   Lungs:  normal respiratory effort, no intercostal retractions, no accessory muscle use, and normal breath sounds.   Heart:  normal rate, regular rhythm, and no murmur.   Neurologic:  no tremor Skin:  color normal.   Psych:  good eye contact, not anxious appearing, and not depressed appearing.     Impression & Recommendations:  Problem # 1:  ADHD (ICD-314.01) Stopped Straterra samples due to nausea. Will retry generic adderall xr --> recheck BP in 2-4 wks (with next testosterone injection).  Problem # 2:  HYPERTENSION (ICD-401.9) Assessment: Improved RFd meds. His updated medication list for this problem includes:    Metoprolol Succinate 25 Mg Xr24h-tab (Metoprolol succinate) .Marland Kitchen... Take 1 tablet by mouth once a day  BP today: 114/71 Prior BP: 133/89 (04/07/2010)  Labs Reviewed: K+: 4.4 (03/03/2010) Creat: : 1.13 (03/03/2010)   Chol: 182 (01/01/2010)   HDL:  34 (01/01/2010)   LDL: 126 (01/01/2010)   TG: 108 (01/01/2010)  Problem # 3:  HYPOGONADISM, MALE (ICD-257.2) Will f/u labs from today - testosterone level, PSA, LFTs.  Watch for rise in LFTs given hx of Hep C. Symptomatically improving on 200 mg q 2 wks.   Orders: Testosterone Cypionat 200mg  ing (J1080) Admin of Therapeutic Inj  intramuscular or subcutaneous (44010)  Complete Medication List: 1)  Multivitamins Tabs (Multiple vitamin) .... Take 1 tablet by mouth once a day 2)  Ventolin Hfa 108 (90 Base) Mcg/act Aers (Albuterol sulfate) .... 2 puffs 4 x a day for the next wk then prn 3)  Metoprolol Succinate 25 Mg  Xr24h-tab (Metoprolol succinate) .... Take 1 tablet by mouth once a day 4)  Adderall Xr 10 Mg Xr24h-cap (Amphetamine-dextroamphetamine) .Marland Kitchen.. 1 capsule by mouth qam  Patient Instructions: 1)  Stay on Metoprolol for BP. 2)  Will call you w/ lab results tomorrow. 3)  Start generic Adderall XR 10 mg every AM (you do not have to take it everyday). 4)  Return in 1 month for next RX pick up and a nurse BP check. Prescriptions: ADDERALL XR 10 MG XR24H-CAP (AMPHETAMINE-DEXTROAMPHETAMINE) 1 capsule by mouth qAM  #30 x 0   Entered and Authorized by:   [name] DO   Signed by:   [name] DO on 06/30/2010   Method used:   Print then Give to Patient   RxID:   [id number] METOPROLOL SUCCINATE 25 MG XR24H-TAB (METOPROLOL SUCCINATE) Take 1 tablet by mouth once a day  #90 x 1   Entered and Authorized by:   [name] DO   Signed by:   [name] DO on 06/30/2010   Method used:   Electronically to        [facility] [phone]* (retail)       [address]       [place], [place]  [address]       Ph: [phone]       Fax: [phone]   RxID:   [id number]    Medication Administration  Injection # 1:    Medication: Testosterone Cypionat 200mg  ing    Diagnosis: HYPOGONADISM, MALE (ICD-257.2)    Route: IM    Site: RUOQ gluteus    Patient tolerated injection without complications    Given by: [name] (June 30, 2010 9:52 AM)  Orders Added: 1)  Testosterone Cypionat 200mg  ing [J1080] 2)  Admin of Therapeutic Inj  intramuscular or subcutaneous [96372] 3)  Est. Patient Level III [70623]

## 2011-01-05 NOTE — Progress Notes (Signed)
Summary: Prior Auth  Phone Note Call from Patient   Caller: Patient Summary of Call: Dr.Bowen       Patient Call Back [phone]  Patient said that the office needs to get a Prior Auth before he can get his Rx...[phone]  and Fax [phone]. patient want a call back when this is done. Initial call taken by: Vanessa [place],  August 05, 2010 12:39 PM  Follow-up for Phone Call        [facility] company- prior [place] initiated and they will fax their decision to our office once reviewed Follow-up by: Kathlene November,  August 06, 2010 9:59 AM  Additional Follow-up for Phone Call Additional follow up Details #1::        Pt notiifed insurance approved androgel and could pick up at pharamcy. kJ LPN Additional Follow-up by: Kathlene November,  August 06, 2010 10:22 AM

## 2011-01-05 NOTE — Medication Information (Signed)
Summary: Approval for Androgel/[employer]  Approval for Androgel/[employer]   Imported By: Lanelle Bal 08/17/2010 09:51:04  _____________________________________________________________________  External Attachment:    Type:   Image     Comment:   External Document

## 2011-01-05 NOTE — Assessment & Plan Note (Signed)
Summary: injection- jr  Nurse Visit   Allergies: 1)  ! 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)    Route: IM    Site: RUOQ gluteus    Exp Date: 10/06/2012    Lot #: Veronda Prude    Mfr: pfizer    Comments: Testosterone 400mg  given IM    Patient tolerated injection without complications    Given by: Kathlene November (May 19, 2010 9:01 AM)  Orders Added: 1)  Testosterone Cypionat 200mg  ing [J1080] 2)  Admin of Therapeutic Inj  intramuscular or subcutaneous [21308]

## 2011-01-05 NOTE — Progress Notes (Signed)
Summary: Refill Androgel  Phone Note Call from Patient   Summary of Call: Pt calls and wants a refill on his Androgel has been using 2 pumps daily and is out. Would like a 90 day supply for it sent to [facility] Initial call taken by: Kathlene November,  August 31, 2010 9:13 AM  Follow-up for Phone Call        Pt would like 90day supply sent to [facility] Follow-up by: [name],  August 31, 2010 9:27 AM  Additional Follow-up for Phone Call Additional follow up Details #1::        labs done in July. #90 day RX sent. He is to [facility] in 3 mos for office visit.   Additional Follow-up by: [name] DO,  August 31, 2010 9:47 AM    Prescriptions: ANDROGEL 1.62% APPLY ONE PUMP PRESS TO EACH UPPER ARM ONCE DAILY  #3 bottles x 0   Entered and Authorized by:   [name] DO   Signed by:   [name] DO on 08/31/2010   Method used:   Printed then faxed to ...       [facility] [phone]* (retail)       [address] [place], [place]  [address]       Ph: [phone]       Fax: [phone]   RxID:   [id number]   Appended Document: Refill Androgel Rx faxed to pharm
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
## 2011-01-05 NOTE — Letter (Signed)
Summary: AUA Questionnaire/[facility]  AUA Questionnaire/[facility]   Imported By: Lanelle Bal 01/13/2010 11:36:53  _____________________________________________________________________  External Attachment:    Type:   Image     Comment:   External Document
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
## 2011-01-05 NOTE — Progress Notes (Signed)
Summary: Requests labs  Phone Note Call from Patient   Caller: Patient Summary of Call: Pt coming in next week for F/U. Pt recently got insurance back and would like to have complete blood work done. If you order, I will fax to lab.  Initial call taken by: [name],  December 29, 2009 11:34 AM  Follow-up for Phone Call        order printed.  have drawn fasting. Follow-up by: [name] DO,  December 29, 2009 11:49 AM  New Problems: SPECIAL SCREENING MALIGNANT NEOPLASM OF PROSTATE (ICD-V76.44)   New Problems: SPECIAL SCREENING MALIGNANT NEOPLASM OF PROSTATE (ICD-V76.44)  Appended Document: Requests labs Pt aware
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
## 2011-01-05 NOTE — Assessment & Plan Note (Signed)
Summary: inj- jr  Nurse Visit   Vitals Entered By: [name] (June 16, 2010 10:19 AM)  Allergies: 1)  ! 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)    Route: IM    Site: LUOQ gluteus    Patient tolerated injection without complications    Given by: [name] (June 16, 2010 10:19 AM)  Orders Added: 1)  Testosterone Cypionat 200mg  ing [J1080] 2)  Admin of Therapeutic Inj  intramuscular or subcutaneous [96372]   Medication Administration  Injection # 1:    Medication: Testosterone Cypionat 200mg  ing    Diagnosis: HYPOGONADISM, MALE (ICD-257.2)    Route: IM    Site: LUOQ gluteus    Patient tolerated injection without complications    Given by: [name] (June 16, 2010 10:19 AM)  Orders Added: 1)  Testosterone Cypionat 200mg  ing [J1080] 2)  Admin of Therapeutic Inj  intramuscular or subcutaneous [[id number]]

## 2011-01-05 NOTE — Letter (Signed)
Summary: Out of Work  [facility]  [address], [address]   [place], [place] [address]   Phone: [phone]  Fax: [phone]    January 12, 2010   Employee:  Ronnie Martin    To Whom It May Concern:   For Medical reasons, please excuse the above named employee from work for the following dates:  Start:   Feb 7th   End:   Feb 13th.  May return sooner if clinically improving.  If you need additional information, please feel free to contact our office.         Sincerely,    [name] DO

## 2011-01-05 NOTE — Progress Notes (Signed)
Summary: pt. with a question  Phone Note Call from Patient   Caller: Patient Call For: [name] DO Summary of Call: Pt. called and states that he is still feeling really dizzy when laying down. Pt. stated that Dr.Tristine Langi mentioned maybe trying a ENT doctor if this did not get better. Pt. is wondering if he needs another appt. with Dr.Tatiyana Foucher or will she go ahead and set him up with ENT? Pt. would like a call back TODAY at #[phone] Initial call taken by: Michaelle Copas,  March 09, 2010 10:14 AM  Follow-up for Phone Call        As stated in my OV note, if still dizzy today would refer to ENT.  Referral put in today for [facility]A.  Let's try to get him in ASAP and send last 2 OV notes. Follow-up by: [name] DO,  March 09, 2010 10:35 AM     Appended Document: pt. with a question Pt aware. Flag sent to [facility]

## 2011-01-05 NOTE — Assessment & Plan Note (Signed)
Summary: INJ  Nurse Visit   Vitals Entered By: [name] (February 10, 2010 9:21 AM)  Allergies: 1)  Codeine  Medication Administration  Injection # 1:    Medication: Testosterone Cypionat 200mg  ing    Diagnosis: HYPOGONADISM, MALE (ICD-257.2)    Route: IM    Site: LUOQ gluteus    Exp Date: 06/05/2012    Lot #: [id number]    Patient tolerated injection without complications    Given by: [name] (February 10, 2010 9:21 AM)  Orders Added: 1)  Testosterone Cypionat 200mg  ing [J1080] 2)  Admin of Therapeutic Inj  intramuscular or subcutaneous [96372]   Medication Administration  Injection # 1:    Medication: Testosterone Cypionat 200mg  ing    Diagnosis: HYPOGONADISM, MALE (ICD-257.2)    Route: IM    Site: LUOQ gluteus    Exp Date: 06/05/2012    Lot #: [id number]    Patient tolerated injection without complications    Given by: [name] (February 10, 2010 9:21 AM)  Orders Added: 1)  Testosterone Cypionat 200mg  ing [J1080] 2)  Admin of Therapeutic Inj  intramuscular or subcutaneous [[id number]]

## 2011-01-05 NOTE — Progress Notes (Signed)
Summary: problem with b/p meds  Phone Note Call from Patient Call back at Home Phone [phone] Call back at cell phone- [phone]   Caller: Patient Reason for Call: Talk to Nurse Summary of Call: Per pt calling having problems with his blood pressure meds.  Initial call taken by: Lorne Skeens,  February 23, 2010 8:58 AM  Follow-up for Phone Call        spoke with pt, he is c/o dizziness when standing. it does not occur everytime he stands but most of the time. he checked his bp at [facility] and it was 106/80 to 125/85. he was also having dizziness when turning his head and the er doctor looked at his ears and thay were fine. he has also seen his medical md recently because of dehydration. pt feels it is his bp meds causing the problems. pt to hold his HCTZ for now, drink more fluids and track his blood pressure and let us know of how he is feeling. Deliah Goody, RN  February 23, 2010 10:46 AM   Additional Follow-up for Phone Call Additional follow up Details #1::        continue off hctz and track BP Ferman Hamming, MD, [facility]  February 24, 2010 11:28 AM
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
## 2011-01-05 NOTE — Assessment & Plan Note (Signed)
Summary: testosterone inj  Nurse Visit   Allergies: 1)  ! 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)    Route: IM    Site: RUOQ gluteus    Exp Date: 10/06/2012    Lot #: [id number]    Comments: testosterone 400mg  given IM    Patient tolerated injection without complications    Given by: Kathlene November (June 02, 2010 9:24 AM)  Orders Added: 1)  Testosterone Cypionat 200mg  ing [J1080] 2)  Admin of Therapeutic Inj  intramuscular or subcutaneous [[id number]]

## 2011-01-05 NOTE — Progress Notes (Signed)
Summary: colonoscopy due in 2012  ---- Converted from flag ---- ---- 01/12/2010 8:47 AM, [name] wrote:   ---- 01/08/2010 9:19 AM, Darral Dash wrote: Per   [facility]   Dr Russella Dar ---pt is not due for Colonoscopy  until 2012   please let Dr Cathey Endow know and get back with me ------------------------------  Appended Document: colonoscopy due in 2012 Pls let pt know that Dr Russella Dar reviewed his record and his colonoscopy is not due until 2012.  Thanks.  [name], D.O.

## 2011-01-05 NOTE — Assessment & Plan Note (Signed)
Summary: testosterone  Nurse Visit   Vitals Entered By: [name] (July 29, 2010 9:34 AM)  Allergies: 1)  ! 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)    Route: IM    Site: LUOQ gluteus    Patient tolerated injection without complications    Given by: [name] (July 29, 2010 9:34 AM)  Orders Added: 1)  Testosterone Cypionat 200mg  ing [J1080] 2)  Admin of Therapeutic Inj  intramuscular or subcutaneous [96372]   Medication Administration  Injection # 1:    Medication: Testosterone Cypionat 200mg  ing    Diagnosis: HYPOGONADISM, MALE (ICD-257.2)    Route: IM    Site: LUOQ gluteus    Patient tolerated injection without complications    Given by: [name] (July 29, 2010 9:34 AM)  Orders Added: 1)  Testosterone Cypionat 200mg  ing [J1080] 2)  Admin of Therapeutic Inj  intramuscular or subcutaneous [[id number]]
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
## 2011-01-05 NOTE — Letter (Signed)
Summary: AUA Questionnaire  AUA Questionnaire   Imported By: Lanelle Bal 09/25/2010 15:36:25  _____________________________________________________________________  External Attachment:    Type:   Image     Comment:   External Document

## 2011-01-05 NOTE — Assessment & Plan Note (Signed)
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.2)    Route: IM    Site: RUOQ gluteus    Patient tolerated injection without complications    Given by: [name] (February 24, 2010 9:27 AM)  Orders Added: 1)  Testosterone Cypionat 200mg  ing [J1080] 2)  Admin of Therapeutic Inj  intramuscular or subcutaneous [96372]   Medication Administration  Injection # 1:    Medication: Testosterone Cypionat 200mg  ing    Diagnosis: HYPOGONADISM, MALE (ICD-257.2)    Route: IM    Site: RUOQ gluteus    Patient tolerated injection without complications    Given by: [name] (February 24, 2010 9:27 AM)  Orders Added: 1)  Testosterone Cypionat 200mg  ing [J1080] 2)  Admin of Therapeutic Inj  intramuscular or subcutaneous [53664]

## 2011-01-05 NOTE — Assessment & Plan Note (Signed)
Summary: NURSE VISIT  Nurse Visit   Vitals Entered By: [name] (March 24, 2010 9:19 AM)  Allergies: 1)  Codeine  Medication Administration  Injection # 1:    Medication: Testosterone Cypionat 200mg  ing    Diagnosis: HYPOGONADISM, MALE (ICD-257.2)    Route: IM    Site: RUOQ gluteus    Exp Date: 03/13    Lot #: [id number]    Patient tolerated injection without complications    Given by: [name] (March 24, 2010 9:19 AM)  Orders Added: 1)  Testosterone Cypionat 200mg  ing [J1080] 2)  Admin of Therapeutic Inj  intramuscular or subcutaneous [96372]   Medication Administration  Injection # 1:    Medication: Testosterone Cypionat 200mg  ing    Diagnosis: HYPOGONADISM, MALE (ICD-257.2)    Route: IM    Site: RUOQ gluteus    Exp Date: 03/13    Lot #: [id number]    Patient tolerated injection without complications    Given by: [name] (March 24, 2010 9:19 AM)  Orders Added: 1)  Testosterone Cypionat 200mg  ing [J1080] 2)  Admin of Therapeutic Inj  intramuscular or subcutaneous [16109]

## 2011-01-05 NOTE — Progress Notes (Signed)
Summary: Xray ?  Phone Note Call from Patient   Caller: Patient Summary of Call: Pt requests to have Xray done here and you to read. Pt doesn't want to go to [facility] for F/U. Please advise.  Initial call taken by: [name],  February 03, 2010 9:40 AM  Follow-up for Phone Call        I spoke directly to Dr Remo Lipps nurse.  She reviewed his notes and his is OVERDUE for a f/u with Dr Edwyna Shell along with chest imaging for hx of LUNG Cancer.    He has to go back to Dr Edwyna Shell for this.  Follow-up by: [name] DO,  February 03, 2010 9:45 AM     Appended Document: Xray ? Pt aware of the above.

## 2011-01-05 NOTE — Consult Note (Signed)
Summary: [facility]  [facility]   Imported By: Lanelle Bal 04/01/2010 08:48:23  _____________________________________________________________________  External Attachment:    Type:   Image     Comment:   External Document

## 2011-01-05 NOTE — Assessment & Plan Note (Signed)
Summary: TEST INJ/  Nurse Visit   Vitals Entered By: [name] (Apr 21, 2010 9:15 AM)  Allergies: 1)  ! 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)    Route: IM    Site: RUOQ gluteus    Comments: 400mg     Patient tolerated injection without complications    Given by: [name] (Apr 21, 2010 9:15 AM)  Orders Added: 1)  Testosterone Cypionat 200mg  ing [J1080] 2)  Admin of Therapeutic Inj  intramuscular or subcutaneous [96372]   Medication Administration  Injection # 1:    Medication: Testosterone Cypionat 200mg  ing    Diagnosis: HYPOGONADISM, MALE (ICD-257.2)    Route: IM    Site: RUOQ gluteus    Comments: 400mg     Patient tolerated injection without complications    Given by: [name] (Apr 21, 2010 9:15 AM)  Orders Added: 1)  Testosterone Cypionat 200mg  ing [J1080] 2)  Admin of Therapeutic Inj  intramuscular or subcutaneous [16109]

## 2011-01-05 NOTE — Progress Notes (Signed)
Summary: Testosterone Rx  Phone Note Call from Patient   Caller: Patient Summary of Call: Pt not satisfied w/ testosterone shots and would like 30 day Rx sent to pharm. Please advise. Initial call taken by: [name],  August 05, 2010 10:38 AM    New/Updated Medications: * ANDROGEL 1.62% APPLY ONE PUMP PRESS TO EACH UPPER ARM ONCE DAILY Prescriptions: ANDROGEL 1.62% APPLY ONE PUMP PRESS TO EACH UPPER ARM ONCE DAILY  #1 BOTTLE x 2   Entered and Authorized by:   [name] DO   Signed by:   [name] DO on 08/05/2010   Method used:   Printed then faxed to ...       [facility] [phone]* (retail)       [address] [place], [place]  [address]       Ph: [phone]       Fax: [phone]   RxID:   [id number]   Appended Document: Testosterone Rx Pt aware of the above

## 2011-01-05 NOTE — Medication Information (Signed)
Summary: Approval for Androgel 1%/[facility]  Approval for Androgel 1%/[facility]   Imported By: Lanelle Bal 08/18/2010 10:37:32  _____________________________________________________________________  External Attachment:    Type:   Image     Comment:   External Document

## 2011-01-05 NOTE — Progress Notes (Signed)
Summary: Bystolic too expensive  Phone Note Call from Patient   Caller: Patient Summary of Call: Pt states Bystolic is too expensive and would like to know if there is a cheaper alternative. Please advise. Initial call taken by: [name],  March 24, 2010 2:55 PM  Follow-up for Phone Call        We can change to metoprolol which is cheaper. Keep f/u with Dr. Cathey Endow as scheduled.  Follow-up by: Nani Gasser MD,  March 24, 2010 4:18 PM    New/Updated Medications: METOPROLOL SUCCINATE 25 MG XR24H-TAB (METOPROLOL SUCCINATE) Take 1 tablet by mouth once a day Prescriptions: METOPROLOL SUCCINATE 25 MG XR24H-TAB (METOPROLOL SUCCINATE) Take 1 tablet by mouth once a day  #30 x 0   Entered and Authorized by:   Nani Gasser MD   Signed by:   Nani Gasser MD on 03/24/2010   Method used:   Electronically to        [facility] [phone]* (retail)       [address]       [place], [place]  [address]       Ph: [phone]       Fax: [phone]   RxID:   [id number]    Pt aware.Arvilla Market CMAMarcelino Duster March 24, 2010 4:23 PM   Appended Document: Bystolic too expensive Lab order put in.  Pls print from [facility].  Needs to be done fasting.  [name], D.O.  Appended Document: Bystolic too expensive faxed

## 2011-01-05 NOTE — Assessment & Plan Note (Signed)
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.2)    Route: IM    Site: LUOQ gluteus    Comments: 400mg     Patient tolerated injection without complications    Given by: [name] (Apr 09, 2010 4:28 PM)  Orders Added: 1)  Testosterone Cypionat 200mg  ing [J1080] 2)  Admin of Therapeutic Inj  intramuscular or subcutaneous [96372]   Medication Administration  Injection # 1:    Medication: Testosterone Cypionat 200mg  ing    Diagnosis: HYPOGONADISM, MALE (ICD-257.2)    Route: IM    Site: LUOQ gluteus    Comments: 400mg     Patient tolerated injection without complications    Given by: [name] (Apr 09, 2010 4:28 PM)  Orders Added: 1)  Testosterone Cypionat 200mg  ing [J1080] 2)  Admin of Therapeutic Inj  intramuscular or subcutaneous [40102]

## 2011-01-07 NOTE — Letter (Signed)
Summary: Colonoscopy Letter  [facility]  [address] [place], [place] [address]   Phone: [phone]  Fax: [phone]      December 16, 2010 MRN: [id number]   LIPA KNAUFF [address] [place], [place]  [address]   Dear Mr. Altemose,   According to your medical record, it is time for you to schedule a Colonoscopy. The American Cancer Society recommends this procedure as a method to detect early colon cancer. Patients with a family history of colon cancer, or a personal history of colon polyps or inflammatory bowel disease are at increased risk.  This letter has been generated based on the recommendations made at the time of your procedure. If you feel that in your particular situation this may no longer apply, please contact our office.  Please call our office at [phone] to schedule this appointment or to update your records at your earliest convenience.  Thank you for cooperating with [place] to provide you with the very best care possible.   Sincerely,  Judie Petit T. Russella Dar, M.D.  [facility] [phone]

## 2011-01-07 NOTE — Progress Notes (Signed)
Summary: Rx refills  Phone Note Refill Request Call back at [facility] Phone [phone]   Refills Requested: Medication #1:  ANDROGEL 1.62% APPLY ONE PUMP PRESS TO EACH UPPER ARM ONCE DAILY.  Medication #2:  METOPROLOL SUCCINATE 25 MG XR24H-TAB Take 1 tablet by mouth once a day Pt requests 90 day supply of meds printed for pickup  Initial call taken by: [name],  December 16, 2010 9:30 AM Caller: Patient Call For: Marcelino Duster Summary of Call: pt calls and request to speak specifically with you. Initial call taken by: Kathlene November LPN,  December 16, 2010 8:31 AM    Prescriptions: METOPROLOL SUCCINATE 25 MG XR24H-TAB (METOPROLOL SUCCINATE) Take 1 tablet by mouth once a day  #90 x 1   Entered and Authorized by:   [name] DO   Signed by:   [name] DO on 12/16/2010   Method used:   Print then Give to Patient   RxID:   [id number] ANDROGEL 1.62% APPLY ONE PUMP PRESS TO EACH UPPER ARM ONCE DAILY  #3 bottles x 0   Entered and Authorized by:   [name] DO   Signed by:   [name] DO on 12/16/2010   Method used:   Print then Give to Patient   RxID:   [id number]

## 2011-01-19 ENCOUNTER — Encounter (INDEPENDENT_AMBULATORY_CARE_PROVIDER_SITE_OTHER): Payer: Self-pay | Admitting: *Deleted

## 2011-01-19 ENCOUNTER — Other Ambulatory Visit: Payer: Self-pay | Admitting: Family Medicine

## 2011-01-19 ENCOUNTER — Encounter (INDEPENDENT_AMBULATORY_CARE_PROVIDER_SITE_OTHER): Payer: Commercial Managed Care - PPO | Admitting: Family Medicine

## 2011-01-19 ENCOUNTER — Encounter: Payer: Self-pay | Admitting: Family Medicine

## 2011-01-19 DIAGNOSIS — Z96649 Presence of unspecified artificial hip joint: Secondary | ICD-10-CM | POA: Insufficient documentation

## 2011-01-19 DIAGNOSIS — Z0000 Encounter for general adult medical examination without abnormal findings: Secondary | ICD-10-CM

## 2011-01-19 DIAGNOSIS — Z85118 Personal history of other malignant neoplasm of bronchus and lung: Secondary | ICD-10-CM

## 2011-01-20 ENCOUNTER — Ambulatory Visit
Admission: RE | Admit: 2011-01-20 | Discharge: 2011-01-20 | Disposition: A | Discharge status: Home / Self Care | Payer: Commercial Managed Care - PPO | Source: Ambulatory Visit | Attending: Family Medicine | Admitting: Family Medicine

## 2011-01-20 ENCOUNTER — Encounter: Payer: Self-pay | Admitting: Family Medicine

## 2011-01-20 DIAGNOSIS — Z85118 Personal history of other malignant neoplasm of bronchus and lung: Secondary | ICD-10-CM

## 2011-01-25 LAB — CONVERTED CEMR LAB
ALT: 97 units/L — ABNORMAL HIGH (ref 0–53)
CO2: 26 meq/L (ref 19–32)
Calcium: 9.1 mg/dL (ref 8.4–10.5)
Chloride: 103 meq/L (ref 96–112)
Cholesterol: 191 mg/dL (ref 0–200)
Creatinine, Ser: 0.96 mg/dL (ref 0.40–1.50)
Glucose, Bld: 98 mg/dL (ref 70–99)
HCT: 50.3 % (ref 39.0–52.0)
PSA: 0.53 ng/mL (ref <=4.00)
Platelets: 200 10*3/uL (ref 150–400)
RBC: 5.66 M/uL (ref 4.22–5.81)
Sodium: 140 meq/L (ref 135–145)
Total Protein: 7.5 g/dL (ref 6.0–8.3)
WBC: 5.3 10*3/uL (ref 4.0–10.5)

## 2011-01-27 ENCOUNTER — Encounter: Payer: Self-pay | Admitting: Family Medicine

## 2011-01-27 NOTE — Letter (Signed)
Summary: Pre Visit Letter Revised  [facility]  [address] [place], [place] [address]   Phone: [phone]  Fax: [phone]        01/19/2011 MRN: [id number] [facility] [address] [place], [place]  [address]  [place]             Procedure Date:  March 02, 2011   recall col-Dr Annamarie Major to the Gastroenterology Division at [facility].    You are scheduled to see a nurse for your pre-procedure visit on February 16, 2011 at 9:30am on the 3rd floor at [facility], [address]  We ask that you try to arrive at our office 15 minutes prior to your appointment time to allow for check-in.  Please take a minute to review the attached form.  If you answer "Yes" to one or more of the questions on the first page, we ask that you call the person listed at your earliest opportunity.  If you answer "No" to all of the questions, please complete the rest of the form and bring it to your appointment.    Your nurse visit will consist of discussing your medical and surgical history, your immediate family medical history, and your medications.   If you are unable to list all of your medications on the form, please bring the medication bottles to your appointment and we will list them.  We will need to be aware of both prescribed and over the counter drugs.  We will need to know exact dosage information as well.    Please be prepared to read and sign documents such as consent forms, a financial agreement, and acknowledgement forms.  If necessary, and with your consent, a friend or relative is welcome to sit-in on the nurse visit with you.  Please bring your insurance card so that we may make a copy of it.  If your insurance requires a referral to see a specialist, please bring your referral form from your primary care physician.  No co-pay is required for this nurse visit.     If you cannot keep your appointment, please call [phone] to cancel or reschedule prior  to your appointment date.  This allows [place] the opportunity to schedule an appointment for another patient in need of care.    Thank you for choosing [facility] for your medical needs.  We appreciate the opportunity to care for you.  Please visit [place] at our website  to learn more about our practice.  Sincerely, The Gastroenterology Division

## 2011-01-27 NOTE — Assessment & Plan Note (Signed)
Summary: CPE   Vital Signs:  Patient profile:   55 year old male Height:      71 inches Weight:      208 pounds BMI:     29.11 O2 Sat:      96 % on Room air Pulse rate:   74 / minute BP sitting:   130 / 86  (left arm) Cuff size:   large  Vitals Entered By: [name] (January 19, 2011 9:00 AM)  O2 Flow:  Room air CC: CPE   Primary Care Provider:  [name] D.O.  CC:  CPE.  History of Present Illness: 55 yo WM presents for CPE.  He is doing well but claims to feel no different on his Androgel.  He is doing well on BP meds, denies CP or DOE.  Occasionally smokes cigars.  He is due for fasting labs, his annual prostate exam.  His colonoscopy, immunizations are UTD.  He had a normal nuclear stress test in 01-2009.  Denies fam hx of premature heart dz, colon cancer or prostate cancer.      Current Medications (verified): 1)  Multivitamins  Tabs (Multiple Vitamin) .... Take 1 Tablet By Mouth Once A Day 2)  Ventolin Hfa 108 (90 Base) Mcg/act Aers (Albuterol Sulfate) .... 2 Puffs 4 X A Day For The Next Wk Then Prn 3)  Metoprolol Succinate 25 Mg Xr24h-Tab (Metoprolol Succinate) .... Take 1 Tablet By Mouth Once A Day 4)  Amphetamine-Dextroamphetamine 5 Mg Tabs (Amphetamine-Dextroamphetamine) .... Take 1 Tab By Mouth Two Times A Day 5)  Androgel 1.62% .... Apply One Pump Press To Each Upper Arm Once Daily  Allergies (verified): 1)  ! Blase Mess 2)  Codeine  Past History:  Past Medical History: Reviewed history from 04/07/2010 and no changes required. 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 F/M healthy sister died of brain aneurysm  Social History: Reviewed history from 05/04/2007 and no changes required. Spent time in prison, mulitple tattoos.  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  started smoking again and  still smokes cigars.  Denies ETOH.  Exercises.  Review of Systems  The patient denies anorexia, fever, weight loss, weight gain, vision loss, decreased hearing, hoarseness, chest pain, syncope, dyspnea on exertion, peripheral edema, prolonged cough, headaches, hemoptysis, abdominal pain, melena, hematochezia, severe indigestion/heartburn, hematuria, incontinence, genital sores, muscle weakness, suspicious skin lesions, transient blindness, difficulty walking, depression, unusual weight change, abnormal bleeding, enlarged lymph nodes, angioedema, breast masses, and testicular masses.    Physical Exam  General:  alert, well-developed, well-nourished, and well-hydrated.   Head:  normocephalic and atraumatic.   Eyes:  pupils equal, pupils round, and pupils reactive to light.   Nose:  no nasal discharge.   Mouth:  good dentition and pharynx pink and moist.   Neck:  no masses.   Lungs:  Normal respiratory effort, chest expands symmetrically. Lungs are clear to auscultation, no crackles or wheezes. Heart:  Normal rate and  regular rhythm. S1 and S2 normal without gallop, murmur, click, rub or other extra sounds. Abdomen:  soft, non-tender, normal bowel sounds, no distention, no masses, no guarding, no hepatomegaly, and no splenomegaly.   Rectal:  No external abnormalities noted. Normal sphincter tone. No rectal masses or tenderness.  hemoccult neg Prostate:  Prostate gland firm and smooth, no enlargement,  nodularity, tenderness, mass, asymmetry or induration. Pulses:  2+ radial and pedal pulses Extremities:  no LE edema, varicosities. Skin:  color normal.  multiple tattooes, solar damage Cervical Nodes:  No lymphadenopathy noted Psych:  good eye contact, not anxious appearing, and not depressed appearing.     Impression & Recommendations:  Problem # 1:  HEALTH SCREENING (ICD-V70.0) Keeping healthy checklist for men reviewed. BP at goal.  BMI 29= overwt.  He is working on [employer] and regular exercise. FAsting lab order printed.  Update testosterone level. DRE done today.  Colonoscopy and stress test are UTD. Immunizations are are UTD. Continue current meds.  Complete Medication List: 1)  Multivitamins Tabs (Multiple vitamin) .... Take 1 tablet by mouth once a day 2)  Ventolin Hfa 108 (90 Base) Mcg/act Aers (Albuterol sulfate) .... 2 puffs 4 x a day for the next wk then prn 3)  Metoprolol Succinate 25 Mg Xr24h-tab (Metoprolol succinate) .... Take 1 tablet by mouth once a day 4)  Amphetamine-dextroamphetamine 5 Mg Tabs (Amphetamine-dextroamphetamine) .... Take 1 tab by mouth two times a day 5)  Androgel 1.62%  .... Apply one pump press to each upper arm once daily  Other Orders: T-CBC No Diff ([id number]) T-Comprehensive Metabolic Panel ([id number]) T-Lipid Profile (34742-59563) T-PSA ([id number]) T-Testosterone; Total [phone]) T- * Misc. Laboratory test [phone]) T-DG Chest 2 View [phone])  Patient Instructions: 1)  Update fasting labs tomorrow. 2)  Will call you w/ results. 3)  Stay on Metoprolol. 4)  I will get you in with ortho for your hip pain.   5)  REturn for f/u low T/ BP in 4 mos.   Orders Added: 1)  T-CBC No Diff [[id number]] 2)  T-Comprehensive Metabolic Panel [[id number]] 3)  T-Lipid Profile [[id number]] 4)  T-PSA [55732-20254] 5)  T-Testosterone; Total [[id number]] 6)  T- * Misc. Laboratory test [99999] 7)  T-DG Chest 2 View [71020] 8)  Est.  Patient age 54-64 [10]  Appended Document: CPE

## 2011-02-11 ENCOUNTER — Encounter: Payer: Self-pay | Admitting: *Deleted

## 2011-02-16 ENCOUNTER — Encounter: Payer: Self-pay | Admitting: Gastroenterology

## 2011-02-23 NOTE — Miscellaneous (Signed)
Summary: [facility]  Clinical Lists Changes  Medications: Added new medication of MOVIPREP 100 GM  SOLR (PEG-KCL-NACL-NASULF-NA ASC-C) As per prep instructions. - Signed Rx of MOVIPREP 100 GM  SOLR (PEG-KCL-NACL-NASULF-NA ASC-C) As per prep instructions.;  #1 x 0;  Signed;  Entered by: Ezra Sites RN;  Authorized by: Meryl Dare MD Clementeen Graham;  Method used: Electronically to [facility]. RX*, [address], [place], [place], [place]  [address], Ph: [phone], Fax: [phone] Observations: Added new observation of ALLERGY REV: Done (02/16/2011 8:57)    Prescriptions: MOVIPREP 100 GM  SOLR (PEG-KCL-NACL-NASULF-NA ASC-C) As per prep instructions.  #1 x 0   Entered by:   Ezra Sites RN   Authorized by:   Meryl Dare MD [facility]   Signed by:   Ezra Sites RN on 02/16/2011   Method used:   Electronically to        [facility]. [facility]* (retail)       [address]       [place], [place]  [address]       Ph: [phone]       Fax: [phone]   RxID:   [id number]

## 2011-02-23 NOTE — Letter (Signed)
Summary: [facility]  [facility]   Imported By: Maryln Gottron 02/16/2011 10:50:51  _____________________________________________________________________  External Attachment:    Type:   Image     Comment:   External Document

## 2011-02-23 NOTE — Letter (Signed)
Summary: [facility] Instructions  [facility]  [address] [place], [place] [address]   Phone: [phone]  Fax: [phone]       Ronnie Martin    1956/07/20    MRN: [id number]        Procedure Day /Date:  Tuesday 03/02/2011     Arrival Time: 9:00 am     Procedure Time: 10:00 am     Location of Procedure:                    _x _  [facility] (4th Floor)   PREPARATION FOR COLONOSCOPY WITH MOVIPREP   Starting 5 days prior to your procedure Thursday 3/22 do not eat nuts, seeds, popcorn, corn, beans, peas,  salads, or any raw vegetables.  Do not take any fiber supplements (e.g. Metamucil, Citrucel, and Benefiber).  THE DAY BEFORE YOUR PROCEDURE         DATE: Monday 3/26  1.  Drink clear liquids the entire day-NO SOLID FOOD  2.  Do not drink anything colored red or purple.  Avoid juices with pulp.  No orange juice.  3.  Drink at least 64 oz. (8 glasses) of fluid/clear liquids during the day to prevent dehydration and help the prep work efficiently.  CLEAR LIQUIDS INCLUDE: Water Jello Ice Popsicles Tea (sugar ok, no milk/cream) Powdered fruit flavored drinks Coffee (sugar ok, no milk/cream) Gatorade Juice: apple, white grape, white cranberry  Lemonade Clear bullion, consomm, broth Carbonated beverages (any kind) Strained chicken noodle soup Hard Candy                             4.  In the morning, mix first dose of MoviPrep solution:    Empty 1 Pouch A and 1 Pouch B into the disposable container    Add lukewarm drinking water to the top line of the container. Mix to dissolve    Refrigerate (mixed solution should be used within 24 hrs)  5.  Begin drinking the prep at 5:00 p.m. The MoviPrep container is divided by 4 marks.   Every 15 minutes drink the solution down to the next mark (approximately 8 oz) until the full liter is complete.   6.  Follow completed prep with 16 oz of clear liquid of your choice (Nothing red or purple).   Continue to drink clear liquids until bedtime.  7.  Before going to bed, mix second dose of MoviPrep solution:    Empty 1 Pouch A and 1 Pouch B into the disposable container    Add lukewarm drinking water to the top line of the container. Mix to dissolve    Refrigerate  THE DAY OF YOUR PROCEDURE      DATE: Tuesday 3/27  Beginning at 5:00 a.m. (5 hours before procedure):         1. Every 15 minutes, drink the solution down to the next mark (approx 8 oz) until the full liter is complete.  2. Follow completed prep with 16 oz. of clear liquid of your choice.    3. You may drink clear liquids until 8:00 am (2 HOURS BEFORE PROCEDURE).   MEDICATION INSTRUCTIONS  Unless otherwise instructed, you should take regular prescription medications with a small sip of water   as early as possible the morning of your procedure.          OTHER INSTRUCTIONS  You will need a responsible adult at least  56 years of age to accompany you and drive you home.   This person must remain in the waiting room during your procedure.  Wear loose fitting clothing that is easily removed.  Leave jewelry and other valuables at home.  However, you may wish to bring a book to read or  an iPod/MP3 player to listen to music as you wait for your procedure to start.  Remove all body piercing jewelry and leave at home.  Total time from sign-in until discharge is approximately 2-3 hours.  You should go home directly after your procedure and rest.  You can resume normal activities the  day after your procedure.  The day of your procedure you should not:   Drive   Make legal decisions   Operate machinery   Drink alcohol   Return to work  You will receive specific instructions about eating, activities and medications before you leave.    The above instructions have been reviewed and explained to me by   Ezra Sites RN  February 16, 2011 9:09 AM     I fully understand and can verbalize these  instructions _____________________________ Date _________

## 2011-03-01 ENCOUNTER — Encounter: Payer: Self-pay | Admitting: Gastroenterology

## 2011-03-02 ENCOUNTER — Encounter: Payer: Self-pay | Admitting: Gastroenterology

## 2011-03-02 ENCOUNTER — Ambulatory Visit (AMBULATORY_SURGERY_CENTER): Payer: Commercial Managed Care - PPO | Admitting: Gastroenterology

## 2011-03-02 VITALS — BP 142/88 | HR 86 | Temp 96.7°F | Resp 18 | Ht 70.5 in | Wt 210.5 lb

## 2011-03-02 DIAGNOSIS — D126 Benign neoplasm of colon, unspecified: Secondary | ICD-10-CM

## 2011-03-02 DIAGNOSIS — Z86010 Personal history of colonic polyps: Secondary | ICD-10-CM

## 2011-03-02 DIAGNOSIS — Z1211 Encounter for screening for malignant neoplasm of colon: Secondary | ICD-10-CM

## 2011-03-02 DIAGNOSIS — K5730 Diverticulosis of large intestine without perforation or abscess without bleeding: Secondary | ICD-10-CM

## 2011-03-02 DIAGNOSIS — K635 Polyp of colon: Secondary | ICD-10-CM

## 2011-03-02 NOTE — Patient Instructions (Addendum)
Multiple Polyps-handout given Diverticulosis-handout given Internal Hemorrhoids-handout given  Repeat Exam in 3 years (2015) Await results of pathology Please eat a diet that is high in fiber-handout given

## 2011-03-02 NOTE — Progress Notes (Signed)
Polyp, diverticulosis, per MD

## 2011-03-03 ENCOUNTER — Telehealth: Payer: Self-pay | Admitting: *Deleted

## 2011-03-03 NOTE — Telephone Encounter (Signed)
Message left to call if needed

## 2011-03-08 ENCOUNTER — Other Ambulatory Visit: Payer: Self-pay | Admitting: Family Medicine

## 2011-03-08 ENCOUNTER — Inpatient Hospital Stay (HOSPITAL_COMMUNITY)
Admission: RE | Admit: 2011-03-08 | Discharge: 2011-03-10 | DRG: 468 | Disposition: A | Discharge status: Home Health | Payer: Commercial Managed Care - PPO | Source: Ambulatory Visit | Attending: Orthopedic Surgery | Admitting: Orthopedic Surgery

## 2011-03-08 ENCOUNTER — Inpatient Hospital Stay (HOSPITAL_COMMUNITY): Payer: Commercial Managed Care - PPO

## 2011-03-08 ENCOUNTER — Encounter: Payer: Self-pay | Admitting: Gastroenterology

## 2011-03-08 DIAGNOSIS — M25459 Effusion, unspecified hip: Secondary | ICD-10-CM | POA: Diagnosis present

## 2011-03-08 DIAGNOSIS — Z79899 Other long term (current) drug therapy: Secondary | ICD-10-CM

## 2011-03-08 DIAGNOSIS — Y831 Surgical operation with implant of artificial internal device as the cause of abnormal reaction of the patient, or of later complication, without mention of misadventure at the time of the procedure: Secondary | ICD-10-CM | POA: Diagnosis present

## 2011-03-08 DIAGNOSIS — Z85118 Personal history of other malignant neoplasm of bronchus and lung: Secondary | ICD-10-CM

## 2011-03-08 DIAGNOSIS — K219 Gastro-esophageal reflux disease without esophagitis: Secondary | ICD-10-CM | POA: Diagnosis present

## 2011-03-08 DIAGNOSIS — R7989 Other specified abnormal findings of blood chemistry: Secondary | ICD-10-CM | POA: Diagnosis present

## 2011-03-08 DIAGNOSIS — Z902 Acquired absence of lung [part of]: Secondary | ICD-10-CM

## 2011-03-08 DIAGNOSIS — I10 Essential (primary) hypertension: Secondary | ICD-10-CM | POA: Diagnosis present

## 2011-03-08 DIAGNOSIS — T8489XA Other specified complication of internal orthopedic prosthetic devices, implants and grafts, initial encounter: Principal | ICD-10-CM | POA: Diagnosis present

## 2011-03-08 DIAGNOSIS — B1920 Unspecified viral hepatitis C without hepatic coma: Secondary | ICD-10-CM | POA: Diagnosis present

## 2011-03-08 DIAGNOSIS — M8560 Other cyst of bone, unspecified site: Secondary | ICD-10-CM | POA: Diagnosis present

## 2011-03-08 DIAGNOSIS — Z96649 Presence of unspecified artificial hip joint: Secondary | ICD-10-CM

## 2011-03-08 DIAGNOSIS — Z7982 Long term (current) use of aspirin: Secondary | ICD-10-CM

## 2011-03-08 DIAGNOSIS — Z8781 Personal history of (healed) traumatic fracture: Secondary | ICD-10-CM

## 2011-03-08 LAB — DIFFERENTIAL
Basophils Absolute: 0 10*3/uL (ref 0.0–0.1)
Eosinophils Relative: 2 % (ref 0–5)
Lymphocytes Relative: 29 % (ref 12–46)
Monocytes Absolute: 0.8 10*3/uL (ref 0.1–1.0)
Monocytes Relative: 10 % (ref 3–12)
Neutro Abs: 4.6 10*3/uL (ref 1.7–7.7)

## 2011-03-08 LAB — SURGICAL PCR SCREEN
MRSA, PCR: NEGATIVE
Staphylococcus aureus: NEGATIVE

## 2011-03-08 LAB — URINALYSIS, ROUTINE W REFLEX MICROSCOPIC
Bilirubin Urine: NEGATIVE
Hgb urine dipstick: NEGATIVE
Ketones, ur: NEGATIVE mg/dL
Nitrite: NEGATIVE
Urobilinogen, UA: 0.2 mg/dL (ref 0.0–1.0)
pH: 7 (ref 5.0–8.0)

## 2011-03-08 LAB — BASIC METABOLIC PANEL
BUN: 23 mg/dL (ref 6–23)
CO2: 26 mEq/L (ref 19–32)
Calcium: 9.4 mg/dL (ref 8.4–10.5)
Creatinine, Ser: 1.02 mg/dL (ref 0.4–1.5)
GFR calc non Af Amer: >60 mL/min (ref >60)
Glucose, Bld: 93 mg/dL (ref 70–99)
Sodium: 138 mEq/L (ref 135–145)

## 2011-03-08 LAB — CBC
HCT: 45.6 % (ref 39.0–52.0)
Hemoglobin: 15.6 g/dL (ref 13.0–17.0)
MCH: 29.5 pg (ref 26.0–34.0)
MCHC: 34.2 g/dL (ref 30.0–36.0)
MCV: 86.2 fL (ref 78.0–100.0)
RDW: 12.7 % (ref 11.5–15.5)

## 2011-03-08 LAB — ABO/RH: ABO/RH(D): A POS

## 2011-03-08 LAB — PROTIME-INR: Prothrombin Time: 13.2 seconds (ref 11.6–15.2)

## 2011-03-09 LAB — BASIC METABOLIC PANEL
CO2: 28 mEq/L (ref 19–32)
Chloride: 105 mEq/L (ref 96–112)
GFR calc Af Amer: >60 mL/min (ref >60)
Sodium: 137 mEq/L (ref 135–145)

## 2011-03-09 LAB — CBC
Hemoglobin: 11.9 g/dL — ABNORMAL LOW (ref 13.0–17.0)
Platelets: 152 10*3/uL (ref 150–400)
RBC: 4.06 MIL/uL — ABNORMAL LOW (ref 4.22–5.81)
WBC: 6.9 10*3/uL (ref 4.0–10.5)

## 2011-03-09 NOTE — Procedures (Signed)
Summary: Colonoscopy  Patient: [name] Note: All result statuses are Final unless otherwise noted.  Tests: (1) Colonoscopy (COL)   COL Colonoscopy           DONE     [facility]     [address]     [place], [place]  [address]          COLONOSCOPY PROCEDURE REPORT          PATIENT:  Ronnie Martin, Ronnie Martin  MR#:  [id number]     BIRTHDATE:  1956/04/13, 54 yrs. old  GENDER:  male     ENDOSCOPIST:  Judie Petit T. Russella Dar, MD, [facility]          PROCEDURE DATE:  03/02/2011     PROCEDURE:  Colonoscopy with biopsy and snare polypectomy     ASA CLASS:  Class II     INDICATIONS:  1) surveillance and high-risk screening  2) history     of pre-cancerous (adenomatous) colon polyps: 12/2005.     MEDICATIONS:   Fentanyl 125 mcg IV, Versed 12 mg IV, Benadryl 25     mg IV     DESCRIPTION OF PROCEDURE:   After the risks benefits and     alternatives of the procedure were thoroughly explained, informed     consent was obtained.  Digital rectal exam was performed and     revealed no abnormalities.   The LB [id number]-[id number] endoscope     was introduced through the anus and advanced to the cecum, which     was identified by both the appendix and ileocecal valve, without     limitations.  The quality of the prep was good, using MoviPrep.     The instrument was then slowly withdrawn as the colon was fully     examined.          <<PROCEDUREIMAGES>>          FINDINGS:  A sessile polyp was found in the ascending colon. It     was 4 mm in size. The polyp was removed using cold biopsy forceps.     A sessile polyp was found in the mid transverse colon. It was 5 mm     in size. Polyp was snared without cautery. Retrieval was     successful. Two polyps were found in the descending colon. They     were 3 - 5 mm in size. Polyps were snared without cautery.     Retrieval was successful. A sessile polyp was found in the sigmoid     colon. It was 6 mm in size. Polyp was snared without cautery.  Retrieval was successful. Moderate diverticulosis was found in the     sigmoid colon.  Otherwise normal colonoscopy without other polyps,     masses, vascular ectasias, or inflammatory changes.  Retroflexed     views in the rectum revealed internal hemorrhoids, small.  The     time to cecum =  7.75  minutes. The scope was then withdrawn (time     =  12.67  min) from the patient and the procedure completed.          COMPLICATIONS:  None          ENDOSCOPIC IMPRESSION:     1) 4 mm sessile polyp in the ascending colon     2) 5 mm sessile polyp in the mid transverse colon     3) 3 - 5 mm Two polyps in the descending colon  4) 6 mm sessile polyp in the sigmoid colon     5) Moderate diverticulosis in the sigmoid colon     6) Internal hemorrhoids          RECOMMENDATIONS:     1) Await pathology results     2) High fiber diet with liberal fluid intake.     3) Repeat Colonoscopy in 3 years if 3 or more polyps are     adenomatous, otherwise 5 years.Judie Petit T. Russella Dar, MD, [facility]          CC:  [name], DO          n.     eSIGNEDVenita Lick. Ladarrell Cornwall at 03/02/2011 10:21 AM          Ludger Nutting, [id number]  Note: An exclamation mark (!) indicates a result that was not dispersed into the flowsheet. Document Creation Date: 03/02/2011 10:22 AM _______________________________________________________________________  (1) Order result status: Final Collection or observation date-time: 03/02/2011 10:14 Requested date-time:  Receipt date-time:  Reported date-time:  Referring Physician:   Ordering Physician: Claudette Head [phone]) Specimen Source:  Source: Launa Grill Order Number: [id number] Lab site:

## 2011-03-10 LAB — CBC
HCT: 34.3 % — ABNORMAL LOW (ref 39.0–52.0)
Hemoglobin: 11.3 g/dL — ABNORMAL LOW (ref 13.0–17.0)
MCH: 29 pg (ref 26.0–34.0)
MCHC: 32.9 g/dL (ref 30.0–36.0)
MCV: 88.2 fL (ref 78.0–100.0)
Platelets: 131 10*3/uL — ABNORMAL LOW (ref 150–400)
RBC: 3.89 MIL/uL — ABNORMAL LOW (ref 4.22–5.81)
RDW: 13 % (ref 11.5–15.5)
WBC: 8.1 10*3/uL (ref 4.0–10.5)

## 2011-03-10 LAB — BASIC METABOLIC PANEL
BUN: 10 mg/dL (ref 6–23)
CO2: 26 mEq/L (ref 19–32)
Calcium: 8.3 mg/dL — ABNORMAL LOW (ref 8.4–10.5)
Chloride: 105 mEq/L (ref 96–112)
Creatinine, Ser: 0.95 mg/dL (ref 0.4–1.5)
GFR calc Af Amer: >60 mL/min (ref >60)
GFR calc non Af Amer: >60 mL/min (ref >60)
Glucose, Bld: 93 mg/dL (ref 70–99)
Potassium: 4 mEq/L (ref 3.5–5.1)
Sodium: 136 mEq/L (ref 135–145)

## 2011-03-11 ENCOUNTER — Telehealth: Payer: Self-pay | Admitting: Gastroenterology

## 2011-03-11 NOTE — Telephone Encounter (Signed)
Received a call from  [place] patient's wife. She is calling because Dr. Ardell Isaacs d/c instructions after procedure said patient would have a 3 year recall for colon if 3 or more polyps are adenomatous. The letter he received states the polyps are adenomatous and the recall is 5 years. Which is correct? Please, speak with Junious Dresser re: this answer.Patient is recovering from hip surgery and she wants to be the one to handle this now. Please, advise.

## 2011-03-11 NOTE — Telephone Encounter (Signed)
Please read carefully the recommendations at the bottom of the 03/02/11 colonoscopy report and the pathology report. 5 year recall is correct as he had 2 adenomatous polyps on pathology report. A 3 year recall would have been recommended if he had 3 or more adenomatous polyps.

## 2011-03-11 NOTE — Telephone Encounter (Signed)
Spoke with patient's wife and gave her Dr. Ardell Isaacs recommendation of 5 year recall and his explanation

## 2011-03-13 NOTE — Op Note (Signed)
NAME:  Ronnie Martin, Ronnie Martin NO.:  [id number]  MEDICAL RECORD NO.:  [id number]           PATIENT TYPE:  I  LOCATION:  0099                         FACILITY:  [facility]  PHYSICIAN:  Madlyn Frankel. Charlann Boxer, M.D.  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  FINDINGS:  The patient was noted to have some pericapsular staining with a joint effusion consistent with metallosis and brownish in hue.  No signs of purulence.  There was some involvement into the gluteus minimus area, but the vascular muscle remained intact and healthy.  There was some extension into the anterior capsule.  Operative findings also included diminished posterior wall thickness, perhaps from index reaming.  There was also a large anterior inferior cyst within the region of the pubis, it was packed with bone graft.  PROCEDURE: 1. Revision of right total hip replacement with allograft bone     grafting. 2. Complete synovectomy and debridement.  COMPONENTS USED:  DePuy revision 56 mm Gription Pinnacle cup, 36 plus 4 AltrX liner, and a 36 plus 8.5 Delta ceramic ball.  SURGEON:  Madlyn Frankel. Charlann Boxer, M.D.  ASSISTANT:  Jaquelyn Bitter. [name], P.A.  ANESTHESIA:  General.  SPECIMEN:  I did send to the lab per requesting attorney, this was femoral head and acetabular shell as well as pericapsular soft tissues.  BLOOD LOSS:  About 700 cc.  COMPLICATIONS:  None apparent.  DRAINS:  One Hemovac.  INDICATIONS FOR PROCEDURE:  [name] is a 55 year old male who had presented to the office for evaluation of his right hip.  He had index right total hip replacement in the form of DePuy ASR components performed about 5 years ago in  [place].  He had some increasing complaints of pain, discomfort, and inability to lay on his right or left side.  He had some mildly elevated labs.  Based on his symptoms and exam findings, I recommended revision surgery. Risks of infection; complication related to metallosis, changes in the body and pericapsular tissues as well as DVT and dislocation were all reviewed and discussed as well as the potential need for revision surgery down the road.  Consent was obtained for the benefit of pain relief.  PROCEDURE IN DETAIL:  The patient was brought to operative theater. Once adequate anesthesia, preoperative antibiotics, Ancef administered, the patient was positioned in the left lateral decubitus position with right side up.  The right lower extremity was then prepped and draped in the sterile fashion.  A time-out was performed identifying the patient, planned procedure and the extremity.  The patient had a previous anterior approach to his hip and we used a more of a lateral approach.  The incision was made based off the proximal trochanter.  Sharp dissection was carried to the iliotibial band and gluteal fascia.  This was incised posteriorly.  Once I removed the short external rotators from the posterior capsule, I  made an L capsulotomy, there was a probable 30 cc to 40 cc of effusion, which was brown stained.  The portion of the case at this point was directed at exposure as well as synovial debridement and capsulotomy based on his brown stained synovial tissue from the metallosis.  Once the hip was exposed, the head was dislocated and the femoral head removed off the trunnion and sent for specimen.  At this point, I was able to elevate the gluteus minimus off the remaining ileum superiorly and placed the trunnion of the femoral component proximally.  Then using Innomed extraction set, we were able to remove the acetabular shell, preserving the remaining bone stock.  I was  able to observe some very thin and almost nonexistent posterior wall perhaps related to initial index surgery reaming.  Following removal of the acetabular shell, I noted a pretty large cyst into the anterior-inferior aspect of the acetabulum in the area of the pubic rami.  I used a curette and debrided this out.  Assessing remaining bone stock indicated the superior ilium intact, anterior wall intact, as well as the pubis and ischium anterior and posterior respectively.  At this point, he was noted to have a 52-mm cup in place before.  I began reaming with this.  There was no way to ream any further into the medial wall.  I expanded my reaming up to just a 54 reamer initially, then trialed with a 55 liner to 55 reamer just to see how the fit was going to be.  Once I was satisfied, I was going to have a decent fixation with the remaining bone stock, a 56 Gription cup was chosen.  I did touch and reamed the acetabulum with a 55 reamer.  I packed the acetabulum at this point with 15 cc of cancellous bone chips focused on the large cystic area in the area of the pubic rami anteriorly.  I reversed reamed this area to pack the bone graft into place.  The final 56 Gription cup was then impacted and seemed to be positioned about 35 to 40 degrees of abduction, was positioned at about 20 degrees of forward flexion beneath the remaining anterior wall.  The initial scratch fit appeared to be adequate and was supported with a single 45-mm screw into the ilium and then with smaller screw just a bit posterior to it with less of any chance of fixation.  A trial 36 plus 4 liner was positioned and placed and the trial reduction carried out.  I initially trialed a 36 plus 5 ball and with this, the combined anteversion was about 45 to 50 degrees.  There was no evidence of impingement, there were a few millimeters of shuck.  Given the lack of impingement throughout the range of motion with  combined anteversion, I went ahead and removed the trial liner and placed a final 36 plus 4 neutral AltrX liner.  At this point, I re-trialed with 36 plus 8.5 ball and found that this reduced the shuck to a couple of millimeters in extension and hip stability remained without evidence of any impingement.  Given this range of motion, I assessed with a final 36 plus 8.5 Delta ceramic ball was impacted on to clean and dry trunnion. I did irrigate out the hip throughout the case again at this point. There was noted to be some corrosive changes on the trunnion, but not significant and not as much as I had seen before.  There was no real pericapsular  tissue to be reapproximated based on the debridement carried out due to metal staining.  I did place a medium Hemovac drain deep.  I reapproximated the iliotibial band and gluteal fascia using #1 Vicryl.  The remainder of the wound was closed with 2-0 Vicryl and running 4-0 Monocryl.  The hip was cleaned, dried and dressed sterilely using Aquacel dressing.  The drain site was dressed separately.  He was then extubated and brought to the recovery room in stable condition, tolerating the procedure well.     Madlyn Frankel Charlann Boxer, M.D.     MDO/MEDQ  D:  03/08/2011  T:  03/09/2011  Job:  [id number]  Electronically Signed by Durene Romans M.D. on 03/13/2011 09:59:57 AM

## 2011-03-13 NOTE — Discharge Summary (Signed)
NAME:  Ronnie Martin, Ronnie Martin              ACCOUNT NO.:  [id number]  MEDICAL RECORD NO.:  [id number]           PATIENT TYPE:  I  LOCATION:  1605                         FACILITY:  [facility]  PHYSICIAN:  Madlyn Frankel. Charlann Boxer, M.D.  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  DISCHARGE DIAGNOSES: 1. Failed right hip replacement status post revision right total hip     replacement on March 08, 2011. 2. Hypertension. 3. Reflux disease. 4. History of hepatitis C. 5. History of lung cancer.  ADMITTING HISTORY:  Ronnie Martin is a 55 year old gentleman with a history of right total hip replacement performed 5 years ago in [place].  He had a DePuy ASR component placed.  He has had persistent and progressive discomfort in his hip since the time of his surgery.  He was seen and evaluated recently, new to this area, regarding this.  He was noted to have significant elevated metal ion levels as well as persistent pain. Workup for infection was negative and leading to diagnosis of metallosis- induced discomfort.  After reviewing with him risks and benefits of proposed intervention, he wished at this point to proceed with hip replacement.  Consent was obtained through the office.  HOSPITAL COURSE:  The patient was admitted for same-day surgery on March 08, 2011.  He underwent revision right total hip arthroplasty.  He was noted at that time to have pretty limited posterior bone stalk and despite having initial good stretch does not seem good cancellous screw fixation and I will have him partial weightbearing.  Postoperatively, he was transferred to the recovery room in orthopedic ward remainder of his hospital stay.  He was seen and evaluated by Physical Therapy with partial weightbearing 50%.  His  Hemovac and Foley removed on day #1.  His postoperative radiographs looked good.  His hematocrit on postop day #2 was 34.3.  He did not require transfusion.  His vital signs remained stable.  By postop day #2, he was ready to go home.  DISCHARGE INSTRUCTIONS:  The patient will be discharged to home with home health physical therapy to work on range of motion, gait training, and strengthening exercises with posterior hip precautions.  He will be partial weightbearing 50% until followup or further directed through the office.  We will see him.  He will return to see Dr. Durene Romans in [facility] [phone] in 2 weeks' time.  His dressing should remain dry.  He should keep his hip wound dry until followup.  Any orthopedic questions can be addressed to our office.  His discharge medications include Colace 100 mg p.o. b.i.d. while on pain medicine for constipation, aspirin 325 mg dose b.i.d. for 30 days followed by again returning to his 81 mg dose; iron sulfate 325 mg 2 to 3 times a day for 2 weeks as tolerable, may be he could stop this earlier; Robaxin 500 mg every 6 hours as needed for muscle spasm and pain,  oxycodone 5 to 10 mg every 4 to 6 as needed for pain, MiraLax 17 g p.o. daily as needed for constipation with pain medicine, Senokot-S 1 tablet as needed for constipation, fish oil over-the-counter as needed daily, metoprolol 25 mg q.a.m., multivitamins p.o. daily, Prilosec 20 mg q.a.m.     Madlyn Frankel Charlann Boxer, M.D.     MDO/MEDQ  D:  03/10/2011  T:  03/11/2011  Job:  [id number]  Electronically Signed by Durene Romans M.D. on 03/13/2011 10:00:02 AM
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
## 2011-03-22 ENCOUNTER — Telehealth: Payer: Self-pay | Admitting: *Deleted

## 2011-03-22 MED ORDER — HYDROXYZINE HCL 50 MG PO TABS: ORAL_TABLET | ORAL | Status: DC

## 2011-03-22 NOTE — Telephone Encounter (Signed)
Pt aware of the above  

## 2011-03-22 NOTE — Telephone Encounter (Signed)
Will fill hydroxyzine for him.  Remember that this may make him sleepy.  Let me know later this wk if it's working.

## 2011-03-22 NOTE — Telephone Encounter (Signed)
See if he can come in at 3:45 today.

## 2011-03-22 NOTE — Telephone Encounter (Signed)
Pt would like hydroxyzine sent to [facility] [address]. Pt will also call surgeon to discuss itching and iron w/ him.

## 2011-03-22 NOTE — Telephone Encounter (Signed)
Pt states he was started on iron after surgery and scalp started burning shortly after taking. Surgeon stopped iron and scalp has stopped burning but now Pt states he has horrible itching all over body. Pt states he has been taking benadryl but is not getting any relief. Pt states he is going crazy due the itch and needs to be seen ASAP. Please advise.

## 2011-04-06 HISTORY — PX: TOTAL HIP REVISION: SHX763
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
## 2011-04-20 NOTE — Assessment & Plan Note (Signed)
OFFICE VISIT   Ronnie Martin, Ronnie Martin  DOB:  1956-07-14                                        February 10, 2010  CHART #:  [id number]   The patient came today.  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  There is no evidence of recurrence on his chest x-ray.  We wanted to get a CT scan but he refused, worried about radiation.  I  explained to him in great detail that he needs at least a CT scan once a  year.  His other problem is he continues to have spasms in his right  chest after working very hard.  We previously treated this with Flexeril  and Ultram.  He says the Flexeril does not help that much, so I switched  him to Robaxin 500 mg 3 times a day as needed.  His blood pressure was  120/82, pulse 100, respirations 18, sats were 97%.  He no longer works  in [employer] and now works as a Water quality scientist at [employer].  I recommend that he come back again in a year with a CT scan this time  rather than a chest x-ray.   Ines Bloomer, M.D.  Electronically Signed   DPB/MEDQ  D:  02/10/2010  T:  02/11/2010  Job:  [id number]
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
## 2011-04-20 NOTE — Assessment & Plan Note (Signed)
OFFICE VISIT   Ronnie Martin, Ronnie Martin  DOB:  January 07, 1956                                        Apr 25, 2008  CHART #:  [id number]   Patient came for followup today.  His CT scan was done, and it showed no  evidence of recurrence of his cancer and just some scarring.   I plan to see him again in six months with a CT scan.  He is now 18  months since his surgery.  He still has some muscle spasms, particularly  when it is very hot.   His blood pressure was 136/100, pulse 92, respirations 18, sats 98%.   Ines Bloomer, M.D.  Electronically Signed   DPB/MEDQ  D:  04/25/2008  T:  04/25/2008  Job:  [id number]

## 2011-04-23 NOTE — Discharge Summary (Signed)
NAMEGORDON, VANDUNK NO.:  [id number]   MEDICAL RECORD NO.:  [id number]          PATIENT TYPE:  INP   LOCATION:  3316                         FACILITY:  [facility]   PHYSICIAN:  Ines Bloomer, M.D. DATE OF BIRTH:  [birth year]   DATE OF ADMISSION:  11/24/2006  DATE OF DISCHARGE:  11/28/2006                               DISCHARGE SUMMARY   ADMISSION DIAGNOSIS:  Right upper lobe lung lesion.   SECONDARY DIAGNOSES:  1. Right upper lobe lung lesion, status post wedge resection.      Pathology showing bronchial alveolar carcinoma, mucinous type stage      T1N0Mx.  2. Postoperative hypoxic respiratory failures secondary to mucus      plugging.  3. History of gastroesophageal reflux disease.  4. History of polysubstance abuse.  5. History of left leg phlebitis 20 years ago.  6. Right hip replacement in February 2007.  7. Left arm plating in 1995.  8. History of hemorrhoids.  9. History of hepatitis C antibodies.   ALLERGIES:  INTOLERANCE TO CODEINE, WHICH CAUSES NAUSEA AND VOMITING.   BRIEF HISTORY:  Mr. Rathbone is a 55 year old Caucasian male who was  evaluated in February 2007 in [place], [place], for right upper lobe  lung lesion.  He had a PET scan that showed a 1.1 SUV.  Bronchoscopy was  negative at that time.  The lesion measured 2.1 cm on CT scan.  A repeat  scan was done in December and the lesion had increased from 2.1 to 2.8  cm in size.  There is a big irregularity and questionable cavitation.  This was thought to be possibly a slow-growing bronchial alveolar cancer  or a granulomatous process.  He quit smoking in December 2005.  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  He was referred to Dr. Algis Downs Karle Plumber for consideration of surgical resection.  Preoperative pulmonary  function test showed an FVC of 4.19 with MVV 103.16.   HOSPITAL COURSE:  Mr. Glosser was electively  admitted to [facility] on November 24, 2006 and underwent a right video-assisted  thoracoscopic surgery with mini thoracotomy for wedge resection of his  right upper lobe lesion.  Postoperatively he was extubated and  neurologically intact and transferred to stepdown unit 3300.  Postoperatively he remained stable but from a pulmonary standpoint had a  lot of chest congestion and coughing and wheezing as of postoperative  day 1.  Subsequently he underwent pulmonary consultation and EzPAP and  nebulizer treatments were ordered.  His wheezing and coughing was felt  secondary to mucus plugging.  BiPAP was also added as needed.  Fortunately these measures were enough to help clear his secretions.  For the next several days, Mr. Borawski made steady progress.  He was  ultimately weaned from supplemental oxygen.  Vital signs remained  stable.  His pathology did show bronchial alveolar cancer as discussed  previously.  Dr. Edwyna Shell will discuss oncology at his followup  appointment.  He was stages 1A.  Mr. Maraj On-Q device for  pain  management, posterior chest tube, were discontinued on postoperative day  2 and his remaining chest tube on postoperative day 3.  His latest chest  x-ray did show a small 5-10% apical pneumothorax following chest tube  removal with a followup chest x-ray ordered within the next 24 hours.  Chest x-ray also reveals a persistent right upper lobe infiltrate.  No  definite effusion was noted.  His white count has remained normal at  7.0.  Progressive pulmonary toilet has been encouraged.  Postoperatively  patient's pain was controlled on morphine PCA.  This has not been  continued and he has been started on Tylox p.r.n.  He is been able to  void following Foley catheter removal.  His other postoperative labs  show white blood count of 7.0, hemoglobin 11.7, hematocrit 34.5,  platelet count 166.  Sodium 136, potassium 4.1, chloride 100, CO2 27,  blood glucose 151,  BUN 8, creatinine 0.8, calcium 9.1.  Respiratory  culture on December 21 has shown rare gram positive rods with rare gram  positive cocci in pairs with finial results still pending.  He has been  on Maxipime IV postoperatively.  Would consider further antibiotics  depending on the final results.  His postoperative liver function tests  were normal other then his AST being elevated at 50.  His total protein  was 6.3 and blood albumin 3.0.  Right lung cultures have shown no yeast  or fungal elements or acid fast bacilli to date.   Mr. Colee continues to make steady progress and his followup chest x-  ray on December 24 shows no increase in size of his small right apical  pneumothorax and it is anticipated that he will be discharged home.   DISCHARGE MEDICATIONS:  1. Tylox 1 to 2 tablets p.o. q.4 hours for pain.  2. Albuterol inhaler 2 puffs inhaled q.4 hours p.r.n.  3. Prilosec p.o. daily p.r.n. per home regimen.   DISCHARGE INSTRUCTIONS:  He may follow a regular diet.  He is to  increase his activity slowly and should avoid driving and heavy lifting  for the next 2 weeks.  He may shower and clean incision gently with soap  and water.  He should call if he develops fever greater then 101, or  redness and drainage from his incision sites, or increased shortness of  breath.  He is to follow up with Dr. Edwyna Shell in [facility] office in 7 to 10  days with a chest x-ray 1 hour before this appointment at [facility].  Dr. Edwyna Shell will discuss oncology followup at that time.      Jerold Coombe, P.A.    ______________________________  Ines Bloomer, M.D.    AWZ/MEDQ  D:  11/27/2006  T:  11/28/2006  Job:  [id number]   cc:   Ines Bloomer, M.D.  Leslye Peer, MD

## 2011-04-23 NOTE — Assessment & Plan Note (Signed)
[facility]                             PULMONARY OFFICE NOTE   VIBHAV, WADDILL                       MRN:          [id number]  DATE:11/04/2006                            DOB:          05/02/1956    REASON FOR CONSULTATION:  This is a self-referral by Mr. Tramell for  abnormal CT scan of the chest and a right upper lobe mass.   BRIEF HISTORY:  Mr. Diffley is a 55 year old man with a history of  tobacco abuse who has moved from [place] to [place].  He was  followed in [place] by Dr. Antoine Primas for a right upper lobe cavitary  mass.  He was originally seen by Dr. Arvid Right in February 2007 at which  time the mass measured 2.8 x 1.4 cm.  He underwent fiberoptic  bronchoscopy with biopsies which was nondiagnostic.  He was at that time  and continues to be asymptomatic.  He is very active.  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  He has had some dyspnea with  heavy exertion.  He also has occasional dry cough that is nonproductive.  He denies any chest discomfort.  He has had no fever, chills or sweats.  He has had approximately 25 pounds of weight gain over the last 2 years.  He does have some occasional GERD symptoms.  A CT scan of the chest  performed in November here in [place] in followup has revealed that  his right upper lobe mass has increased in size compared with February  2007.  It is now 2.8 x 1.4 cm in diameter.  There are no other obvious  lesions or lymphadenopathy.  He presents today to discuss the next steps  for definitive diagnosis and treatment.   PAST MEDICAL HISTORY:  1. History of right ankle fracture with chronic right ankle pain.  2. Hip replacement in February 2007.  3. History of mumps and meningitis as a youth.  4. Hypotestosteronism.  5. Tuberculosis exposure in the fairly recent past - either 1998 or      2000.  He was never treated.  He is unclear as to whether he has      had a positive  PPD.   ALLERGIES:  CODEINE causes nausea.   CURRENT MEDICATIONS:  None.   SOCIAL HISTORY:  The patient is married.  He lives with his wife and has  just moved from [place] to [place].  He works as a Music therapist.  He  is a former smoker with a 40 pack-year total history.  He quit in  December 2005.  He also has stopped drinking alcohol.  He formerly used  cocaine and marijuana and quit in December 2004.   FAMILY HISTORY:  Significant for coronary disease and skin cancer.   REVIEW OF SYSTEMS:  Is as per the HPI.   EXAMINATION:  GENERAL:  This is a fairly-well-appearing gentleman in no  distress on room air.  Weight 214 pounds, temperature 97.9, blood  pressure 132/88, heart rate 75, SPO2 97% on room air.  HEENT:  Benign.  LUNGS:  Clear bilaterally.  There is no wheezing.  HEART:  Has a regular rate and rhythm without  murmur.  ABDOMEN:  Obese, soft, nontender, with positive bowel sounds.  EXTREMITIES:  Have no cyanosis, clubbing or edema.  NEUROLOGIC:  He has grossly nonfocal exam.   STUDIES:  CT scans of the chest performed in February and in November  2007 are as detailed above.  Spirometry was performed in [place] and  is available for review.  This was done in February 2007.  His vital  capacity was 4.19 or 88% of predicted.  His FEV1 was 3.16 or 82% of  predicted.  His ratio was 75%.  PET scan performed in February 2007  showed his patchy nodular lesion in the right upper lobe had only mild  enhancement.   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   PLAN:  I will refer Mr. Tassin to [facility] for excision to obtain a tissue  diagnosis and also hopefully definitive therapy.  I have discussed the  case with Dr. Dewayne Shorter and he agrees that this will need to be  resected, even in absence of PET scan positivity.  We will arrange for  resection after he has a consultation with Dr. Edwyna Shell.      Leslye Peer, MD  Electronically Signed    RSB/MedQ  DD: 12/02/2006  DT: 12/02/2006  Job #: [id number]   cc:   Ines Bloomer, M.D.  Antoine Primas, M.D.

## 2011-04-23 NOTE — H&P (Signed)
NAME:  Ronnie Martin, Ronnie Martin NO.:  [id number]   MEDICAL RECORD NO.:  [id number]           PATIENT TYPE:   LOCATION:                                 FACILITY:   PHYSICIAN:  Ines Bloomer, M.D.      DATE OF BIRTH:   DATE OF ADMISSION:  DATE OF DISCHARGE:                              HISTORY & PHYSICAL   CHIEF COMPLAINT:  Right upper lobe lung mass.   HISTORY OF PRESENT ILLNESS:  This patient was worked up on January 14, 2006 in [place], [place] for a right upper lobe lesion.  He had a PET  scan that was 1.1 SUV.  Bronchoscopy was negative at that time.  The  lesion measured 2.1 cm on CT scan.  There is a repeat scan done in  December and the lesion had increased from 2.1 to 2.8 cm in size.  This  is a big irregularity and questionable cavitation.  This was thought to  be possibly a slow growing bronchial alveolar cancer or a granulomatous  process.  He quit smoking in December 2005.  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   PAST MEDICAL HISTORY:  Significant for reflux.   MEDICATIONS:  He takes Prilosec 20 mg a day and Tums as needed.   ALLERGIES:  CODEINE.   FAMILY HISTORY:  Non-contributory.   SOCIAL HISTORY:  He is married.  His wife is disabled.  He works as a  Music therapist.  He does not smoke, quit smoking in December 2005 as  mentioned.  He does not drink alcohol on a regular basis.   REVIEW OF SYSTEMS:  VITAL SIGNS:  He has had some weight gain.  He is  210 lb.  He is 5'11.  CARDIAC:  He has no angina or atrial  fibrillation.  He does get shortness of breath with exertion.  PULMONARY:  He has no history of bronchitis or asthma.  GI:  He has  reflux.  GU:  No kidney disease, dysuria or frequent urination.  VASCULAR:  No claudication, DVT or TIAs.  NEUROLOGIC:  No headaches,  blackouts or seizures. MUSCULOSKELETAL:  No joint pains or myalgias.  PSYCHIATRIC:  No psychiatric  illnesses.  ENT:  No changes in eyesight or  hearing.  HEMATOLOGICAL:  No problems with bleeding or clotting  disorders or anemia.   PHYSICAL EXAMINATION:  GENERAL:  He is a well-developed, Caucasian male  in no acute distress.  VITAL SIGNS:  His blood pressure is 120/70, pulse 76, respirations 18,  saturations 95%.  HEAD:  Atraumatic.  EYES:  Pupils equal, round, reactive to light and accommodation.  Extraocular movements normal.  EARS:  Tympanic membranes intact.  NOSE:  No septal deviation.  THROAT:  Without lesion.  NECK:  Supple with no thyromegaly.  No carotid bruits.  No  supraventricular axial adenopathy.  CHEST:  Clear to auscultation and percussion.  HEART:  Regular sinus rhythm.  No murmurs.  ABDOMEN:  Soft.  No hepatosplenomegaly.  EXTREMITIES:  Pulses 2+.  No clubbing  or edema.  NEUROLOGICAL:  He is oriented times three.  Sensory and motor intact.  Cranial nerves are intact.   LABORATORY DATA:  Pulmonary function test showed an FVC of 4.19 with  FEV1 of 3.16.   IMPRESSION:  1. Enlarging right upper lobe lesion, rule out cancer, rule out      granulomatous process.  2. Gastroesophageal reflux disease.   PLAN:  Right  VATs wedge resection or possible right upper lobectomy.           ______________________________  Ines Bloomer, M.D.     DPB/MEDQ  D:  11/22/2006  T:  11/23/2006  Job:  [id number]

## 2011-04-23 NOTE — Op Note (Signed)
NAMEEDKER, PUNT NO.:  [id number]   MEDICAL RECORD NO.:  [id number]          PATIENT TYPE:  INP   LOCATION:  3316                         FACILITY:  [facility]   PHYSICIAN:  Ines Bloomer, M.D. DATE OF BIRTH:  11/28/1956   DATE OF PROCEDURE:  11/24/2006  DATE OF DISCHARGE:                               OPERATIVE REPORT   PREOPERATIVE DIAGNOSIS:  Right upper lobe lesion.   POSTOPERATIVE DIAGNOSIS:  Right upper lobe lesion.   OPERATIONS PERFORMED:  Right video-assisted thoracoscopic surgery,  minithoracotomy, wedge resection of right upper lobe lesion.   SURGEON:  Ines Bloomer, M.D.   FIRST ASSISTANT:  Coral Ceo, P.A.-C.   ANESTHESIA:  General anesthesia.   DESCRIPTION OF PROCEDURE:  After percutaneous insertion of all  monitoring lines, the patient underwent general anesthesia and he was  turned to the right lateral thoracoscopy position.  A double-lumen tube  was inserted and the right lung was deflated.  He was prepped and draped  in the usual sterile manner.  Two trocar sites were made in the anterior  and posterior axillary line at the seventh intercostal space.  Two  trocars were inserted.  A 0-degree scope was inserted.  The lesion was  seen in the posterior segment of the right upper lobe.  A 7-cm incision  was made over the fifth intercostal space.  The latissimus was partially  divided.  The serratus was reflected anteriorly.  A small Tuffier was  placed in this space.  The lesion was grasped with a [employer] winged  forceps and then resected with the EZ45 stapler.  One area had to be  oversewn with 3-0 Vicryl.  The lesion was sent for frozen section, and  it was a mucinous lesion, possible well differentiated adenocarcinoma;  margins were negative.  Nodes were sampled from the 10R nodes.  Two  chest tubes were brought in through the trocar sites and tied in place  with 0 silk.  CoSeal was applied at the staple line.  The chest was  closed  with 4 paracostals, drilling through the fifth rib, and #1 Vicryl  in the muscle, and 2-0 Vicryl in the subcutaneous tissue, and Dermabond  for the skin.  The patient returned to the recovery room in stable  condition.           ______________________________  Ines Bloomer, M.D.     DPB/MEDQ  D:  11/24/2006  T:  11/24/2006  Job:  [id number]   cc:   Jovita Gamma, MD

## 2011-04-23 NOTE — Assessment & Plan Note (Signed)
[facility]                         GASTROENTEROLOGY OFFICE NOTE   Ronnie Martin, Ronnie Martin                       MRN:          [id number]  DATE:01/20/2007                            DOB:          07-Apr-1956    PROBLEM:  Follow-up colonoscopy, history of colon polyps.   HISTORY:  Ronnie Martin is a 55 year old white male who recently relocated  to [place] from [place], [place]. He has a history of hepatitis C  which he learned of approximately 2 years ago and has not yet been  treated.  He is in the process of being referred to [facility] Hepatitis C clinic. He also has a  recent diagnosis of lung cancer and underwent a wedge resection November 24, 2006 with Dr. Edwyna Shell. Apparently he did not need any further  therapy.  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  He is  status post right hip replacement in February of 2007, and has a  titanium rod in his left forearm as well.   The patient brought a copy of a post colonoscopy instruction sheet. He  had colonoscopy done December 15, 2005 by a physician in [place]. This  showed that he had one polyp removed and also had diverticulosis. The  polyp was in the hepatic flexure. We have since obtained copies of the  colonoscopy report and the pathology report which shows a fragment of an  early tubular adenoma with very mild dysplastic changes. The polyp was 1  cm in size.   The patient states that he was told to have follow-up colonoscopy in 1  year which is why he presents today.   He has had no GI complaints. Has no problems with abdominal pain,  changes in his bowel habits, melena or hematochezia. He had been having  some periodic heart burn which has been much improved since he has lost  some weight intentionally.   CURRENT MEDICATIONS:  No regular daily medications. He uses a  testosterone cream and an aspirin daily.   ALLERGIES:  CODEINE (CAUSES  NAUSEA).   PAST MEDICAL HISTORY:  As outlined above.   SOCIAL HISTORY:  The patient is married, he does not have any children.  He is employed as a Advice worker. He denies alcohol or drug use. He is a  nonsmoker.   FAMILY HISTORY:  Negative for colon cancer or polyps, otherwise  noncontributory.   REVIEW OF SYSTEMS:  CARDIOVASCULAR - negative for chest pain or anginal  symptoms. PULMONARY - is pertinent for some shortness of breath with  exertion due to his recent lung surgery.  GENITOURINARY - negative for  dysuria, urgency or frequency. GI - negative as above. MUSCULOSKELETAL -  negative.   PHYSICAL EXAMINATION:  GENERAL:  A well-developed white male in no acute  distress. Height is 5 feet, 11 inches. Weight is 213.  HEENT: Normocephalic, atraumatic. Extraocular movements are intact.  PERLA. Sclerae are anicteric.  CARDIOVASCULAR: Regular rate and rhythm with S1 and S2.  PULMONARY: Clear to A&P.  ABDOMEN: Soft and nontender, there is no palpable mass or  hepatosplenomegaly.  No guarding or rebound.  RECTAL: Exam was not done today.  SKIN: The patient does have several tattoos.   IMPRESSION:  51. A 55 year old male with history of adenomatous colon polyp, status      post polypectomy, January, 2007.  2. Hepatitis C, active.  3. 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   PLAN:  1. Will plan follow-up colonoscopy in January of 2012 which will be a      5-year interval. He does not need follow-up colonoscopy at this      time.  2. Proceed with referral to a hepatitis C clinic for treatment. That      apparently is in progress though [facility] [employer].  3. Follow-up with Dr. Russella Dar in the interim on a p.r.n. basis.      Mike Gip, PA-C  Electronically Signed      Venita Lick. Russella Dar, MD, [facility]  Electronically Signed   AE/MedQ  DD: 01/20/2007  DT: 01/20/2007  Job #: [id number]

## 2011-05-11 ENCOUNTER — Ambulatory Visit: Payer: Commercial Managed Care - PPO | Admitting: Family Medicine

## 2011-05-14 ENCOUNTER — Telehealth: Payer: Self-pay | Admitting: *Deleted

## 2011-05-14 DIAGNOSIS — B182 Chronic viral hepatitis C: Secondary | ICD-10-CM

## 2011-05-14 NOTE — Telephone Encounter (Signed)
Pt aware of the above  

## 2011-05-14 NOTE — Telephone Encounter (Signed)
He should be OFF Androgel b/c his liver enzymes were high in FEB.  We are due to recheck his liver enzymes and his testosterone.  Will not need PSA checked since he's been of androgel for the past 4 mos.  Lab order is for liver panel only.  Everything else is UTD. This can be done non fasting.

## 2011-05-14 NOTE — Telephone Encounter (Signed)
Pt called asking if he should have labs done prior to apt and if he needs to be fasting. Pt also would like to know if you are going to do a prostate exam. Please advise.

## 2011-05-27 ENCOUNTER — Telehealth: Payer: Self-pay | Admitting: *Deleted

## 2011-05-27 DIAGNOSIS — E291 Testicular hypofunction: Secondary | ICD-10-CM

## 2011-05-28 ENCOUNTER — Telehealth: Payer: Self-pay | Admitting: Family Medicine

## 2011-05-28 LAB — HEPATIC FUNCTION PANEL
ALT: 63 U/L — ABNORMAL HIGH (ref 0–53)
AST: 43 U/L — ABNORMAL HIGH (ref 0–37)
Alkaline Phosphatase: 52 U/L (ref 39–117)
Bilirubin, Direct: 0.1 mg/dL (ref 0.0–0.3)
Total Bilirubin: 0.5 mg/dL (ref 0.3–1.2)

## 2011-05-28 NOTE — Telephone Encounter (Signed)
Lab ordered.

## 2011-05-28 NOTE — Telephone Encounter (Signed)
Called patient: Liver enzymes look slightly better than last time. Recheck in 4 months.

## 2011-05-31 ENCOUNTER — Encounter: Payer: Self-pay | Admitting: Family Medicine

## 2011-05-31 ENCOUNTER — Ambulatory Visit (INDEPENDENT_AMBULATORY_CARE_PROVIDER_SITE_OTHER): Payer: Commercial Managed Care - PPO | Admitting: Family Medicine

## 2011-05-31 VITALS — BP 134/91 | HR 91 | Ht 70.5 in | Wt 209.0 lb

## 2011-05-31 DIAGNOSIS — E291 Testicular hypofunction: Secondary | ICD-10-CM

## 2011-05-31 NOTE — Progress Notes (Signed)
  Subjective:    Patient ID: Ronnie Martin, male    DOB: Apr 15, 1956, 55 y.o.   MRN: [id number]  HPI 55 yo WM presents for f/u hypogonadism.  He was on Androgel for a few months and was noticing some clinical improvements but unfortunately, we saw a rise in his liver enzymes with an underlying hx of hepatitis C.  He really wants to go back on testosterone replacement.  Denies chest pain, edema, heart palpitations or SOB.    BP 134/91  Pulse 91  Ht 5' 10.5" (1.791 m)  Wt 209 lb (94.802 kg)  BMI 29.56 kg/m2  SpO2 95%    Review of Systems as per HPI    Objective:   Physical Exam  Constitutional: He appears well-developed and well-nourished.  HENT:  Mouth/Throat: Oropharynx is clear and moist.  Cardiovascular: Normal rate and normal heart sounds.   Pulmonary/Chest: Effort normal and breath sounds normal.  Skin: Skin is warm and dry.       No jaundice          Assessment & Plan:

## 2011-05-31 NOTE — Patient Instructions (Addendum)
Referral made to Dr Morrison Old for low T (with elevated liver enzymes).  Will call you w/ testosterone level tomorrow.  Return for f/u BP in 4 mos.

## 2011-05-31 NOTE — Assessment & Plan Note (Signed)
We reviewed his rise in liver enzymes from use of Androgel.  He has underlying Hep C.  He agrees to see Endocrinology to discuss.

## 2011-06-03 ENCOUNTER — Telehealth: Payer: Self-pay | Admitting: Family Medicine

## 2011-06-03 NOTE — Telephone Encounter (Signed)
Pt called and requested to talk with Payton Spark, CMA.  Plan:  Will send a message to Marcelino Duster so she can return the call to the patient. Jarvis Newcomer, LPN Domingo Dimes

## 2011-06-04 ENCOUNTER — Telehealth: Payer: Self-pay | Admitting: Family Medicine

## 2011-06-04 NOTE — Telephone Encounter (Signed)
Pt aware of the above  

## 2011-06-04 NOTE — Telephone Encounter (Signed)
Pls let pt know that his testosterone level is fairly low (as expected off testosterone) at 291.  Will fax this to Dr Morrison Old -- endocrinologist he will see for Low T.  His cobalt level is coming down nicely from 142--> 30.5 now.  Will fax copy to his orthopedist.

## 2011-06-04 NOTE — Telephone Encounter (Signed)
All Pt wanted was lab results. Will work on for him

## 2011-06-13 ENCOUNTER — Telehealth: Payer: Self-pay | Admitting: Family Medicine

## 2011-06-13 NOTE — Telephone Encounter (Signed)
Pls let pt know that I reviewed his notes from Dr Morrison Old and he's beeen given the green light to restart testosterone - either shots or topicals.  Which does he prefer?

## 2011-06-14 NOTE — Telephone Encounter (Signed)
LMOM informing Pt of the above 

## 2011-06-15 ENCOUNTER — Telehealth: Payer: Self-pay | Admitting: Family Medicine

## 2011-06-15 MED ORDER — AMBULATORY NON FORMULARY MEDICATION: Status: DC

## 2011-06-15 MED ORDER — METOPROLOL SUCCINATE ER 25 MG PO TB24: 25.0000 mg | ORAL_TABLET | Freq: Every day | ORAL | Status: DC

## 2011-06-15 NOTE — Telephone Encounter (Signed)
Rx's were faxed to [facility] pharm as ordered. Jarvis Newcomer, LPN Domingo Dimes

## 2011-06-15 NOTE — Telephone Encounter (Signed)
Pt. Called and is returning a call to Payton Spark, CMA.  Pt prefers the testosterone gel and wants a 3 month supply sent to [facility] pharmacy.  [phone]. Pt also requesting Metoprolol 25 mg PO QD in 3 month supply sent to the same pharmacy. Plan:  Routed to [facility], CMA Jarvis Newcomer, LPN Domingo Dimes

## 2011-06-15 NOTE — Telephone Encounter (Signed)
Meds printed.  Fax to his pharmacy.

## 2011-08-11 ENCOUNTER — Other Ambulatory Visit: Payer: Self-pay | Admitting: Thoracic Surgery

## 2011-08-11 DIAGNOSIS — R0602 Shortness of breath: Secondary | ICD-10-CM

## 2011-08-11 DIAGNOSIS — C3410 Malignant neoplasm of upper lobe, unspecified bronchus or lung: Secondary | ICD-10-CM

## 2011-08-17 ENCOUNTER — Encounter: Payer: Self-pay | Admitting: Thoracic Surgery

## 2011-08-17 ENCOUNTER — Ambulatory Visit (INDEPENDENT_AMBULATORY_CARE_PROVIDER_SITE_OTHER): Payer: Commercial Managed Care - PPO | Admitting: Thoracic Surgery

## 2011-08-17 ENCOUNTER — Encounter: Payer: Self-pay | Admitting: *Deleted

## 2011-08-17 ENCOUNTER — Ambulatory Visit
Admission: RE | Admit: 2011-08-17 | Discharge: 2011-08-17 | Disposition: A | Discharge status: Home / Self Care | Payer: Commercial Managed Care - PPO | Source: Ambulatory Visit | Attending: Thoracic Surgery | Admitting: Thoracic Surgery

## 2011-08-17 ENCOUNTER — Ambulatory Visit: Payer: Commercial Managed Care - PPO | Admitting: Thoracic Surgery

## 2011-08-17 VITALS — BP 118/80 | HR 72 | Resp 18 | Ht 71.5 in | Wt 214.0 lb

## 2011-08-17 DIAGNOSIS — C3410 Malignant neoplasm of upper lobe, unspecified bronchus or lung: Secondary | ICD-10-CM

## 2011-08-17 DIAGNOSIS — R0602 Shortness of breath: Secondary | ICD-10-CM

## 2011-08-17 DIAGNOSIS — C3490 Malignant neoplasm of unspecified part of unspecified bronchus or lung: Secondary | ICD-10-CM

## 2011-08-17 NOTE — Progress Notes (Signed)
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 There is no evidence of recurrence on his CT scan although I do not have the official report. He is complaining of some episodic shortness of breath but overall is doing well will be followed by his medical doctor the    Current Outpatient Prescriptions  Medication Sig Dispense Refill  . aspirin 81 MG tablet Take 81 mg by mouth daily.        Marland Kitchen ibuprofen (ADVIL,MOTRIN) 200 MG tablet Take 600 mg by mouth every 6 (six) hours as needed.        . metoprolol succinate (TOPROL-XL) 25 MG 24 hr tablet Take 1 tablet (25 mg total) by mouth daily.  90 tablet  3  . multivitamin (THERAGRAN) per tablet Take 1 tablet by mouth daily.        . Omega-3 Fatty Acids (FISH OIL PO) Take 1 capsule by mouth.        . Omeprazole (PRILOSEC PO) Take by mouth.        . testosterone (ANDROGEL) 50 MG/5GM GEL Place 5 g onto the skin 2 days.           Review of Systems: No change   Physical Exam  Cardiovascular: Normal rate, regular rhythm, normal heart sounds and intact distal pulses.   Pulmonary/Chest: Effort normal and breath sounds normal. No respiratory distress.     Diagnostic Tests: CT scan shows no evidence of recurrence of his cancer.   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   Plan: Followup by PCP

## 2011-08-30 ENCOUNTER — Other Ambulatory Visit: Payer: Self-pay | Admitting: Family Medicine

## 2011-08-30 MED ORDER — METOPROLOL SUCCINATE ER 25 MG PO TB24: 25.0000 mg | ORAL_TABLET | Freq: Every day | ORAL | Status: DC

## 2011-08-30 NOTE — Telephone Encounter (Signed)
Pt calling for refill of his metoprolol 25 mg.  Wants 90 day supplies sent to [facility]. Plan:  Sent over remaining refills for # 90 /2 refills. Jarvis Newcomer, LPN Domingo Dimes

## 2011-09-23 LAB — CBC
HCT: 47.4
MCHC: 33.8
MCV: 84.6
Platelets: 195
RDW: 13.8
WBC: 7.1

## 2011-09-23 LAB — PROTIME-INR: Prothrombin Time: 12.9

## 2012-01-18 ENCOUNTER — Other Ambulatory Visit: Payer: Self-pay | Admitting: Family Medicine

## 2012-01-18 ENCOUNTER — Telehealth: Payer: Self-pay | Admitting: *Deleted

## 2012-01-18 DIAGNOSIS — R899 Unspecified abnormal finding in specimens from other organs, systems and tissues: Secondary | ICD-10-CM

## 2012-01-19 LAB — BASIC METABOLIC PANEL
Calcium: 9.4 mg/dL (ref 8.4–10.5)
Glucose, Bld: 91 mg/dL (ref 70–99)
Potassium: 4.7 mEq/L (ref 3.5–5.3)
Sodium: 140 mEq/L (ref 135–145)

## 2012-01-19 LAB — HEPATIC FUNCTION PANEL
ALT: 102 U/L — ABNORMAL HIGH (ref 0–53)
AST: 68 U/L — ABNORMAL HIGH (ref 0–37)
Albumin: 4.5 g/dL (ref 3.5–5.2)
Alkaline Phosphatase: 63 U/L (ref 39–117)
Indirect Bilirubin: 0.6 mg/dL (ref 0.0–0.9)
Total Protein: 7.5 g/dL (ref 6.0–8.3)

## 2012-01-19 LAB — CBC WITH DIFFERENTIAL/PLATELET
Basophils Absolute: 0 10*3/uL (ref 0.0–0.1)
Basophils Relative: 1 % (ref 0–1)
Eosinophils Absolute: 0.3 10*3/uL (ref 0.0–0.7)
Hemoglobin: 16.1 g/dL (ref 13.0–17.0)
MCH: 30.3 pg (ref 26.0–34.0)
MCHC: 34.8 g/dL (ref 30.0–36.0)
Monocytes Relative: 9 % (ref 3–12)
Neutro Abs: 2.9 10*3/uL (ref 1.7–7.7)
Neutrophils Relative %: 55 % (ref 43–77)
Platelets: 178 10*3/uL (ref 150–400)
RDW: 13.4 % (ref 11.5–15.5)

## 2012-01-25 LAB — COBALT: Cobalt, Plasma: 2.1 mcg/L — ABNORMAL HIGH (ref <1.9)

## 2012-01-26 ENCOUNTER — Ambulatory Visit (INDEPENDENT_AMBULATORY_CARE_PROVIDER_SITE_OTHER): Payer: Commercial Managed Care - PPO | Admitting: Family Medicine

## 2012-01-26 ENCOUNTER — Encounter: Payer: Self-pay | Admitting: Family Medicine

## 2012-01-26 VITALS — BP 121/70 | HR 65 | Wt 207.0 lb

## 2012-01-26 DIAGNOSIS — I10 Essential (primary) hypertension: Secondary | ICD-10-CM

## 2012-01-26 DIAGNOSIS — E663 Overweight: Secondary | ICD-10-CM | POA: Insufficient documentation

## 2012-01-26 DIAGNOSIS — Z23 Encounter for immunization: Secondary | ICD-10-CM

## 2012-01-26 DIAGNOSIS — E291 Testicular hypofunction: Secondary | ICD-10-CM

## 2012-01-26 DIAGNOSIS — B1910 Unspecified viral hepatitis B without hepatic coma: Secondary | ICD-10-CM

## 2012-01-26 DIAGNOSIS — E669 Obesity, unspecified: Secondary | ICD-10-CM

## 2012-01-26 MED ORDER — TESTOSTERONE 20.25 MG/1.25GM (1.62%) TD GEL: 1.0000 | Freq: Every day | TRANSDERMAL | Status: DC

## 2012-01-26 NOTE — Progress Notes (Signed)
  Subjective:    Patient ID: Ronnie Martin, male    DOB: 12-09-1955, 55 y.o.   MRN: [id number]  Hypertension This is a chronic problem. The current episode started more than 1 year ago. The problem is controlled. Pertinent negatives include no blurred vision, chest pain or shortness of breath. There are no associated agents to hypertension. Past treatments include beta blockers. The current treatment provides no improvement. There are no compliance problems.     Also here to review bloodwork.    Hypogonadism-he is currently doing one pump on his gel 3 days per week. Before he was doing 2 pounds daily and his levels were a little too high. He wants to see what they're doing now.  Notes his is getting Hep B series through work. This is hsi second time. Evidenlty he wasn't immune.   Review of Systems  Eyes: Negative for blurred vision.  Respiratory: Negative for shortness of breath.   Cardiovascular: Negative for chest pain.       Objective:   Physical Exam  Constitutional: He is oriented to person, place, and time. He appears well-developed and well-nourished.  HENT:  Head: Normocephalic and atraumatic.  Cardiovascular: Normal rate, regular rhythm and normal heart sounds.        No carotid bruits.   Pulmonary/Chest: Effort normal and breath sounds normal.  Neurological: He is alert and oriented to person, place, and time.  Skin: Skin is warm and dry.  Psychiatric: He has a normal mood and affect. His behavior is normal.          Assessment & Plan:  HTN - Doing well. At goal. F/U in 6 weeks. REviewed labs.   Hypogonadism- Based on labs inc dose to 1 pump daliy and recheck in 6-8 weeks.    Chronic Hep C- Liver enzymes are up from his baseline.  REcheck in 6-8 weeks to see if go back down or stay up. He doen'st drink alcohol and avoid all Tylenol products. Workn on low fat diet. If still elevated then will need [place] to eval for cirrhosis and consider referral to hepatology.

## 2012-02-02 ENCOUNTER — Telehealth: Payer: Self-pay | Admitting: *Deleted

## 2012-02-02 ENCOUNTER — Ambulatory Visit (INDEPENDENT_AMBULATORY_CARE_PROVIDER_SITE_OTHER): Payer: Commercial Managed Care - PPO | Admitting: Family Medicine

## 2012-02-02 ENCOUNTER — Ambulatory Visit
Admission: RE | Admit: 2012-02-02 | Discharge: 2012-02-02 | Disposition: A | Discharge status: Home / Self Care | Payer: Commercial Managed Care - PPO | Source: Ambulatory Visit | Attending: Family Medicine | Admitting: Family Medicine

## 2012-02-02 ENCOUNTER — Encounter: Payer: Self-pay | Admitting: Family Medicine

## 2012-02-02 VITALS — BP 112/73 | HR 72 | Ht 71.0 in | Wt 208.0 lb

## 2012-02-02 DIAGNOSIS — K529 Noninfective gastroenteritis and colitis, unspecified: Secondary | ICD-10-CM

## 2012-02-02 DIAGNOSIS — R0602 Shortness of breath: Secondary | ICD-10-CM

## 2012-02-02 DIAGNOSIS — R0789 Other chest pain: Secondary | ICD-10-CM

## 2012-02-02 DIAGNOSIS — K5289 Other specified noninfective gastroenteritis and colitis: Secondary | ICD-10-CM

## 2012-02-02 NOTE — Telephone Encounter (Signed)
Pt called requesting Xray results. I simply read to him what radiologist stated. Pt would like to know what else could be restricting his breathing or making it hard for him to take deep breaths. Please advise.

## 2012-02-02 NOTE — Progress Notes (Signed)
  Subjective:    Patient ID: Ronnie Martin, male    DOB: 1956/01/20, 55 y.o.   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 Then had vomiting and diarrhea that lasted for 24 hours. Still feels weak but now feels really SOB. No cough.  No longer vomiting. Diarrhea done.  Eating ok now.  No Cough. No fevers. No prior hx of DVt. Takes a baby ASA daily.    Review of Systems     Objective:   Physical Exam  Constitutional: He is oriented to person, place, and time. He appears well-developed and well-nourished.  HENT:  Head: Normocephalic and atraumatic.  Cardiovascular: Normal rate, regular rhythm and normal heart sounds.   Pulmonary/Chest: Effort normal and breath sounds normal.  Neurological: He is alert and oriented to person, place, and time.  Skin: Skin is warm and dry.  Psychiatric: He has a normal mood and affect. His behavior is normal.          Assessment & Plan:  Gastroenteriits - Resolving.  Possibly viral vs food poisoning.   SOB - could be secondary to rib muscle strain secondary to severe vomiting though however like to get a chest x-ray to rule out infection or pneumonia or possibly a chemical pneumonitis from aspiration. His pulse ox and chest exam is normal today which is reassuring. He's also been afebrile. No prior history of blood clots we will hold off on checking a d-dimer if his chest x-ray is normal and left the symptoms do not improve or he suddenly gets worse.

## 2012-02-02 NOTE — Patient Instructions (Signed)
Hydration and rest WE will call you with the chest xray results.

## 2012-02-02 NOTE — Telephone Encounter (Signed)
I still think it is pulled muscles around his ribs from such forceful vomiting. Lets order a d-dimer on him just o be 100% sure it is nothing else. I put in order. Please re-print (sorry hit sign form home) and fax downstairs. Pt to go in AM.

## 2012-02-03 NOTE — Telephone Encounter (Signed)
Pt aware. He is feeling a little better and will decide later if he wants to go to lab for this.

## 2012-02-04 ENCOUNTER — Telehealth: Payer: Self-pay | Admitting: *Deleted

## 2012-02-04 LAB — D-DIMER, QUANTITATIVE: D-Dimer, Quant: 0.28 ug/mL-FEU (ref 0.00–0.48)

## 2012-02-04 LAB — BRAIN NATRIURETIC PEPTIDE: Brain Natriuretic Peptide: 11.4 pg/mL (ref 0.0–100.0)

## 2012-02-04 NOTE — Telephone Encounter (Signed)
I looked at the film and didn't see any rib fractures but If he is worried about a specific rib area then I recommend rib films which will show the area in more detail and can better see hairline fractures than what is seen on plain CXR.

## 2012-02-04 NOTE — Telephone Encounter (Signed)
Pt states that he will call us if he decide to have the additional xrays done.

## 2012-02-04 NOTE — Telephone Encounter (Signed)
Pt wants his xr to be looked at again. States that he feels like he has a knife in his left chest and back when he breathes. States that he is concerned that he might have a broken rib. He went for blood work and xray yesterday. Please advise.

## 2012-02-07 ENCOUNTER — Telehealth: Payer: Self-pay | Admitting: Family Medicine

## 2012-02-07 ENCOUNTER — Ambulatory Visit
Admission: RE | Admit: 2012-02-07 | Discharge: 2012-02-07 | Disposition: A | Discharge status: Home / Self Care | Payer: Commercial Managed Care - PPO | Source: Ambulatory Visit | Attending: Family Medicine | Admitting: Family Medicine

## 2012-02-07 ENCOUNTER — Telehealth: Payer: Self-pay | Admitting: *Deleted

## 2012-02-07 DIAGNOSIS — R0602 Shortness of breath: Secondary | ICD-10-CM

## 2012-02-07 NOTE — Telephone Encounter (Signed)
X-ray was negative for fracture the ribs. If he is still having shortness of breath one week from now and then please let me know. Otherwise I will try to get this through the week to see if he starts to improve. He can certainly use Aleve or ibuprofen or Tylenol for pain relief. He can also apply heat for 10-15 minutes at a time and this may help. Also slow deep breathing techniques can help.

## 2012-02-07 NOTE — Telephone Encounter (Signed)
Xray order for rib entered for shortness of breath and pain

## 2012-02-08 NOTE — Telephone Encounter (Signed)
Wife informed. States pt is taking Tylenol to relieve the pain. States pt went to work today and is applying the heat. Will call back if not better in 1 week.

## 2012-03-07 ENCOUNTER — Telehealth: Payer: Self-pay | Admitting: Family Medicine

## 2012-03-07 MED ORDER — TESTOSTERONE 20.25 MG/1.25GM (1.62%) TD GEL: 2.0000 | Freq: Every day | TRANSDERMAL | Status: DC

## 2012-03-07 NOTE — Telephone Encounter (Signed)
Will change androgel to 2 pump.

## 2012-04-24 ENCOUNTER — Telehealth: Payer: Self-pay | Admitting: *Deleted

## 2012-04-24 NOTE — Telephone Encounter (Signed)
LMOM

## 2012-04-24 NOTE — Telephone Encounter (Signed)
Pt is unsure what he is suppose to f/u with you for. Office note from 2/13 states 6 weeks for HTN which should have been in April. His appt also shows with Dr. Thurmond Butts but states you are his provider. He would also like to know if we can send an rx for prilosec to the pharmacy. Please advise.

## 2012-04-24 NOTE — Telephone Encounter (Signed)
Yes, needs f/u appt with either me or Thurmond Butts, either one.  Ok to refill prilosec.

## 2012-04-25 ENCOUNTER — Other Ambulatory Visit: Payer: Self-pay | Admitting: *Deleted

## 2012-04-25 MED ORDER — OMEPRAZOLE 20 MG PO CPDR: 20.0000 mg | DELAYED_RELEASE_CAPSULE | Freq: Every day | ORAL | Status: DC

## 2012-04-27 ENCOUNTER — Ambulatory Visit (INDEPENDENT_AMBULATORY_CARE_PROVIDER_SITE_OTHER): Payer: Commercial Managed Care - PPO | Admitting: Family Medicine

## 2012-04-27 ENCOUNTER — Ambulatory Visit: Payer: Commercial Managed Care - PPO | Admitting: Family Medicine

## 2012-04-27 ENCOUNTER — Encounter: Payer: Self-pay | Admitting: Family Medicine

## 2012-04-27 VITALS — BP 124/75 | HR 69 | Temp 97.8°F | Ht 71.0 in | Wt 213.0 lb

## 2012-04-27 DIAGNOSIS — E291 Testicular hypofunction: Secondary | ICD-10-CM

## 2012-04-27 DIAGNOSIS — E349 Endocrine disorder, unspecified: Secondary | ICD-10-CM

## 2012-04-27 DIAGNOSIS — R748 Abnormal levels of other serum enzymes: Secondary | ICD-10-CM

## 2012-04-27 DIAGNOSIS — I10 Essential (primary) hypertension: Secondary | ICD-10-CM

## 2012-04-27 LAB — HEPATIC FUNCTION PANEL
Albumin: 4.6 g/dL (ref 3.5–5.2)
Alkaline Phosphatase: 60 U/L (ref 39–117)
Total Bilirubin: 0.6 mg/dL (ref 0.3–1.2)

## 2012-04-27 NOTE — Patient Instructions (Signed)
Measles, Mumps, Rubella, Varicella (MMRV) Vaccine What You Need to Know MEASLES, MUMPS, RUBELLA, AND VARICELLA Measles, Mumps, and Rubella, and Varicella (chickenpox) can be serious diseases. Measles  Causes rash, cough, runny nose, eye irritation, and fever.   Can lead to ear infection, pneumonia, seizures, brain damage, and death.  Mumps  Causes fever, headache, and swollen glands.   Can lead to deafness, meningitis (infection of the brain and spinal cord covering), infection of the pancreas, painful swelling of the testicles or ovaries, and rarely, death.  Rubella ([place] Measles)  Causes rash and mild fever, and can cause arthritis (mostly in women).   If a woman gets rubella while she is pregnant, she could have a miscarriage or her baby could be born with serious birth defects.  Varicella (Chickpox)  Causes a rash, itching, fever, and tiredness.   Can lead to severe skin infection, scars, pneumonia, brain damage, or death.   Can re-emerge years later as a painful rash called shingles.  These diseases can spread from person to person through the air. Varicella can also be spread through contact with fluid from chickenpox blisters.  Before vaccines, these diseases were very common in the [place].  MMRV VACCINE MMRV vaccine may be given to children from 1 through 65 years of age to protect them from these 4 diseases. Two doses of MMRV vaccine are recommended:  The first dose at 12 through 62 months of age.   The second dose at 4 through 57 years of age.  These are recommended ages. But children can get the second dose up through 12 years as long as it is at least 3 months after the first dose. Children may also get these vaccines as 2 separate shots: MMR (measles, mumps and rubella) and varicella vaccines. 1 Shot (MMRV) or 2 Shots (MMR & Varicella)?  Both options give the same protection.   One less shot with MMRV.   Children who got the first dose as MMRV have had  more fevers and fever-related seizures (about 1 in 1,250) than children who got the first dose as separate shots of MMR and varicella vaccines on the same day (about 1 in 2,500).  Your healthcare provider can give you more information, including the Vaccine Information Statements for MMR and Varicella vaccines. Anyone 74 or older who needs protection from these diseases should get MMR and varicella vaccines as separate shots. MMRV may be given at the same time as other vaccines. SOME CHILDREN SHOULD NOT GET MMRV VACCINE OR SHOULD WAIT Children should not get MMRV vaccine if they:  Have ever had a life-threatening allergic reaction to a previous dose of MMRV vaccine, or to either MMR or varicella vaccine.   Have ever had a life-threatening allergic reaction to any component of the vaccine, including gelatin or the antibiotic neomycin. Tell the doctor if your child has any severe allergies.   Have HIV/AIDS, or another disease that affects the immune system.   Are being treated with drugs that affect the immune system, including high doses of oral steroids for 2 weeks or longer.   Have any kind of cancer.   Are being treated for cancer with radiation or drugs.  Check with your doctor if the child:  Has a history of seizures, or has a parent, brother, or sister with a history of seizures.   Has a parent, brother, or sister with a history of immune system problems.   Has ever had a low platelet count or another blood  disorder.   Recently had a transfusion or received other blood products.   Might be pregnant.  Children who are moderately or severely ill at the time the shot is scheduled should usually wait until they recover before getting MMRV vaccine. Children who are only mildly ill may usually get the vaccine. Ask your provider for more information.  WHAT ARE THE RISKS FROM MMRV VACCINE? A vaccine, like any medicine, is capable of causing serious problems, such as severe allergic  reactions. The risk of MMRV vaccine causing serious harm, or death, is extremely small. Getting MMRV vaccine is much safer than getting measles, mumps, rubella, or chickenpox. Most children who get MMRV vaccine do not have any problems with it. Mild Problems  Fever (up to 1 child out of 5).   Mild rash (about 1 child out of 20).   Swelling of glands in the cheeks or neck (rare).  If these problems happen, it is usually within 5 to 12 days after the first dose. They happen less often after the second dose. Moderate Problems  Seizure caused by fever (about 1 child in 1,250 who get MMRV), usually 5 to 12 days after the first dose. They happen less often when MMR and varicella vaccines are given at the same visit as separate shots (about 1 child in 2,500 who get these two vaccines), and rarely after a 2nd dose of MMRV.   Temporary low platelet count, which can cause a bleeding disorder (about 1 child out of 40,000).  Severe Problems (Very Rare) Several severe problems have been reported following MMR vaccine, and might also happen after MMRV. These include severe allergic reactions (fewer than 4 per million), and problems such as:  Deafness.   Long-term seizures, coma, or lowered consciousness.   Permanent brain damage.  Because these problems occur so rarely, we can't be sure whether they are caused by the vaccine or not.  WHAT IF THERE IS A SEVERE REACTION? What should I look for? Any unusual condition, such as a high fever or behavior changes. Signs of a severe allergic reaction can include difficulty breathing, hoarseness or wheezing, hives, paleness, weakness, a fast heartbeat, or dizziness. What should I do?  Call a doctor, or get the person to a doctor right away.   Tell your doctor what happened, the date and time it happened, and when the vaccination was given.   Ask your provider to report the reaction by filing a Vaccine Adverse Event Reporting System (VAERS) form. Or, you  can file this report through the VAERS website at www.vaers.LAgents.no or by calling 1-208-550-5971.  VAERS does not provide medical advice. THE NATIONAL VACCINE INJURY COMPENSATION PROGRAM The National Vaccine Injury Compensation Program (VICP) was created in 1986. Persons who believe they may have been injured by a vaccine may file a claim with VICP by calling 1-([phone] or visiting their website at SpiritualWord.at HOW CAN I LEARN MORE?  Ask your provider. They can give you the vaccine package insert or suggest other sources of information.   Call your local or state health department.   Contact the Centers for Disease Control and Prevention (CDC):   Call (951) 874-4369 (1-800-CDC-INFO).   Visit CDC's website at PicCapture.uy  CDC MMRV Interim VIS (04/25/09) Document Released: 11/11/2011 Document Reviewed: 11/01/2011 [facility] Patient Information 2012 [place], [place].Chickenpox (Varicella) Chickenpox (Varicella) is a viral infection that is more common in children. It tends to be a mild illness for most healthy children. It can be more severe in:  Adults.  Newborns.   People with immune system problems.   People receiving cancer treatment.  CAUSES  Chickenpox is caused by a virus called Varicella-Zoster Virus (VZV). VZV causes both chickenpox and shingles. To get chickenpox, a susceptible person (able to catch an infection) must be exposed to either someone with chickenpox or shingles. A person is susceptible if:  They have not had the infection before.   They were not immunized against VZV.   An immunization did not give complete protection against VZV (breakthrough chickenpox).  Chickenpox is very contagious. It is contagious from 1 to 2 days before the rash appears. It is also contagious until the blisters are crusted. The blisters usually become crusted 3 to 7 days after the rash begins. It usually takes about 2 weeks before symptoms show  up. SYMPTOMS  Typical chickenpox symptoms include:  Fever.   Headache.   Poor appetite.   An itchy rash that changes over time:   It starts as red spots that become bumps.   Bumps become blisters.   Blisters turn into scabs.  Breakthrough chickenpox happens when an immunized person still gets chickenpox. The symptoms are less severe. The rash may only be red spots or bumps, with no blisters or scabs. Fever may be low or absent. DIAGNOSIS  Typical chickenpox is diagnosed by physical exam. A blood test can confirm the diagnosis, when the disease is not certain. TREATMENT  Most of the time, in healthy children, only home treatments are needed. In some cases, in the early stages of chickenpox, your caregiver may prescribe antiviral medicines. These medicines may decrease the severity of the illness and prevent complications. In the rare complicated case, treatment in the hospital is needed. Intravenous (IV) medicine and other treatments can be given in the hospital. Your caregiver may prescribe medicine to relieve itching. To prevent the spread of chickenpox to at risk people, your caregiver may prescribe:  Immunization.   Antiviral medicine.   Immune globulin.  HOME CARE INSTRUCTIONS   For fever:   Do not give aspirin to children. This could lead to brain and liver damage through Reye's syndrome. Read the label on over-the-counter medicines used.   Only take over-the-counter or prescription medicines for pain, discomfort or fever as directed by your caregiver.   For itching:   If your caregiver prescribed medicine, take as directed.   You may use plain calamine lotion on the itching sores. Follow the directions on the label. Do not use on sores in the mouth.   Avoid scratching the rash or picking off the scabs. Keep fingernails cut short and clean. Put cotton gloves or socks on your child's hands at night.   Keep a child with chickenpox quiet and cool. Sweating and  overheating makes itching worse. Stay out of the sun.   Cool compresses may be applied to itchy areas.   Cool water baths with baking soda or oatmeal soap may help.   For painful sores in the mouth; pain medicine and cold foods, like frozen pops, may feel good.   Drink plenty of fluids. Avoid salty or acidic liquids (tomato or orange juice). These irritate mouth sores and cause pain.   People with chickenpox should avoid exposure (being in the same room) with:   Pregnant women (especially if they have not had chickenpox or been immunized against it).   Young infants.   People receiving cancer treatments or long-term steroids.   People with immune system problems.   The elderly.   Any  child or adult with chickenpox should stay home until all blisters have crusted. If there are no blisters, the child or adult should stay home until no new spots show up.  SEEK MEDICAL CARE IF:   You or your child has an oral temperature above 102 F (38.9 C).   Your baby is older than 3 months with a rectal temperature of 100.5 F (38.1 C) or higher for more than 1 day.   The sores are infected. Look for:   Swelling.   Increasing redness.   Red streaks.   Tenderness.   Yellow or green pus coming from blisters.   Cough.   New symptoms develop that concern you.  SEEK IMMEDIATE MEDICAL CARE IF:  You or your child develops:  Vomiting.   Confusion, unusual sleepiness or odd behavior.   Neck stiffness.   Seizures (convulsions).   Loss of balance.   Chest pain.   Trouble breathing or fast breathing.   Blood in urine.   Rectal bleeding.   Bruising of the skin or bleeding in the blisters.   Blisters in the eye.   Eye pain, redness or decreased vision.   You or your child has an oral temperature above 102 F (38.9 C), not controlled by medicine.   Your baby is older than 3 months with a rectal temperature of 102 F (38.9 C) or higher.   Your baby is 95 months old or  younger with a rectal temperature of 100.4 F (38 C) or higher.  MAKE SURE YOU:   Understand these instructions.   Will watch your condition.   Will get help right away if you are not doing well or get worse.  Document Released: 11/19/2000 Document Revised: 11/11/2011 Document Reviewed: 06/06/2008 [facility] Patient Information 2012 [place], [place].Varicella Virus Vaccine Live injection What is this medicine? VARICELLA VIRUS VACCINE (var uh SEL uh VAHY ruhs vak SEEN) is used to prevent infections of chickpox. This medicine may be used for other purposes; ask your health care provider or pharmacist if you have questions. What should I tell my health care provider before I take this medicine? They need to know if you have any of the following conditions: -blood disorders or disease -cancer like leukemia or lymphoma -immune system problems or therapy -infection with fever -recent immune globulin therapy -tuberculosis -an unusual or allergic reaction to vaccines, neomycin, gelatin, other medicines, foods, dyes, or preservatives -pregnant or trying to get pregnant -breast-feeding How should I use this medicine? This vaccine is for injection under the skin. It is given by a health care professional. A copy of Vaccine Information Statements will be given before each vaccination. Read this sheet carefully each time. The sheet may change frequently. Talk to your pediatrician regarding the use of this medicine in children. While this drug may be prescribed for children as young as 70 months of age for selected conditions, precautions do apply. Overdosage: If you think you have taken too much of this medicine contact a poison control center or emergency room at once. NOTE: This medicine is only for you. Do not share this medicine with others. What if I miss a dose? Keep appointments for follow-up (booster) doses as directed. It is important not to miss your dose. Call your doctor or health care  professional if you are unable to keep an appointment. What may interact with this medicine? Do not take this medicine with any of the following medications: -adalimumab -anakinra -etanercept -infliximab -medicines that suppress your immune system This  medicine may also interact with the following medications: -aspirin and aspirin-like medicines -blood transfusions -immunoglobulins -medicines to treat cancer -steroid medicines like prednisone or cortisone This list may not describe all possible interactions. Give your health care provider a list of all the medicines, herbs, non-prescription drugs, or dietary supplements you use. Also tell them if you smoke, drink alcohol, or use illegal drugs. Some items may interact with your medicine. What should I watch for while using this medicine? Visit your doctor for regular check ups. This vaccine, like all vaccines, may not fully protect everyone. After receiving this vaccine it may be possible to pass chickenpox infection to others. For up to 6 weeks, avoid people with immune system problems, pregnant women who have not had chickenpox, and newborns of women who have not had chickenpox. Talk to your doctor for more information. Do not become pregnant for 3 months after taking this vaccine. Women should inform their doctor if they wish to become pregnant or think they might be pregnant. There is a potential for serious side effects to an unborn child. Talk to your health care professional or pharmacist for more information. What side effects may I notice from receiving this medicine? Side effects that you should report to your doctor or health care professional as soon as possible: -allergic reactions like skin rash, itching or hives, swelling of the face, lips, or tongue -breathing problems -extreme changes in behavior -feeling faint or lightheaded, falls -fever over 102 degrees F -pain, tingling, numbness in the hands or feet -redness,  blistering, peeling or loosening of the skin, including inside the mouth -seizures -unusually weak or tired Side effects that usually do not require medical attention (report to your doctor or health care professional if they continue or are bothersome): -aches or pains -chickenpox-like rash -diarrhea -low-grade fever under 102 degrees F -loss of appetite -nausea, vomiting -redness, pain, swelling at site where injected -sleepy -trouble sleeping This list may not describe all possible side effects. Call your doctor for medical advice about side effects. You may report side effects to FDA at 1-800-FDA-1088. Where should I keep my medicine? This drug is given in a hospital or clinic and will not be stored at home. NOTE: This sheet is a summary. It may not cover all possible information. If you have questions about this medicine, talk to your doctor, pharmacist, or health care provider.  2012, [employer]vier/[employer] Standard. (08/19/2008 5:19:05 PM)

## 2012-04-27 NOTE — Progress Notes (Signed)
  Subjective:    Patient ID: Ronnie Martin, male    DOB: July 31, 1956, 56 y.o.   MRN: [id number]  HPI  Patient is here for several issues.  #1 Hypertension. He was not sure why he needed HBP follow up #2 elevated liver enzymes. PSA has ben under 1. Hx of hepatitis 30 years ago and w/Hep B test now negative would have to assume what he had was Hep C. #3 Hypogonadism no problems at this time since dosing increased. #4 immunization status concern since he is to visit his mother soon who recently was DX w/ shingles Review of Systems  All other systems reviewed and are negative.      BP 124/75  Pulse 69  Temp(Src) 97.8 F (36.6 C) (Oral)  Ht 5\' 11"  (1.803 m)  Wt 213 lb (96.616 kg)  BMI 29.71 kg/m2  SpO2 97% Objective:   Physical Exam  Vitals reviewed. Constitutional: He is oriented to person, place, and time. He appears well-developed and well-nourished.  Cardiovascular: Normal rate, regular rhythm and normal heart sounds.   Pulmonary/Chest: Effort normal and breath sounds normal. No respiratory distress.  Musculoskeletal: Normal range of motion. He exhibits no edema.  Neurological: He is alert and oriented to person, place, and time.  Skin: Skin is warm.  Psychiatric: He has a normal mood and affect. His behavior is normal.      Assessment & Plan:  #1 hypertension under good control w/Toprol XL #2&#3 Hepatic and testosterone levels will be checked and as per Dr Shelah Lewandowsky note consider sending to heptalogist if enzymes are still elevated #4 discussed extensively risk for contracting chicken pox or shingles from his mother. Since he had a preemployment blood draw yesterday if they can report levels are fine for chicken pox no further teatment is needed. Explained Zoster occurs when his immune system blinks or fails and tht is when he would get zoster since he had chicken pox as a  Child. Follow up w/Dr Linford Arnold in 6 month.

## 2012-04-28 LAB — TESTOSTERONE, FREE, TOTAL, SHBG
Sex Hormone Binding: 61 nmol/L (ref 13–71)
Testosterone, Free: 41.6 pg/mL — ABNORMAL LOW (ref 47.0–244.0)
Testosterone-% Free: 1.3 % — ABNORMAL LOW (ref 1.6–2.9)
Testosterone: 319.69 ng/dL (ref 300–890)

## 2012-05-03 ENCOUNTER — Telehealth: Payer: Self-pay | Admitting: *Deleted

## 2012-05-03 ENCOUNTER — Other Ambulatory Visit: Payer: Self-pay | Admitting: *Deleted

## 2012-05-03 ENCOUNTER — Other Ambulatory Visit: Payer: Self-pay | Admitting: Family Medicine

## 2012-05-03 DIAGNOSIS — K760 Fatty (change of) liver, not elsewhere classified: Secondary | ICD-10-CM

## 2012-05-03 MED ORDER — TESTOSTERONE 20.25 MG/1.25GM (1.62%) TD GEL: 3.0000 | Freq: Every day | TRANSDERMAL | Status: DC

## 2012-06-15 ENCOUNTER — Other Ambulatory Visit: Payer: Self-pay | Admitting: *Deleted

## 2012-06-15 MED ORDER — TESTOSTERONE 20.25 MG/1.25GM (1.62%) TD GEL: 3.0000 | Freq: Every day | TRANSDERMAL | Status: DC

## 2012-06-15 MED ORDER — METOPROLOL SUCCINATE ER 25 MG PO TB24: 25.0000 mg | ORAL_TABLET | Freq: Every day | ORAL | Status: DC

## 2012-06-20 ENCOUNTER — Ambulatory Visit (INDEPENDENT_AMBULATORY_CARE_PROVIDER_SITE_OTHER): Payer: 59 | Admitting: Family Medicine

## 2012-06-20 ENCOUNTER — Other Ambulatory Visit: Payer: Self-pay | Admitting: Family Medicine

## 2012-06-20 ENCOUNTER — Encounter: Payer: Self-pay | Admitting: Family Medicine

## 2012-06-20 VITALS — BP 133/78 | HR 72 | Ht 70.5 in | Wt 212.0 lb

## 2012-06-20 DIAGNOSIS — R252 Cramp and spasm: Secondary | ICD-10-CM

## 2012-06-20 DIAGNOSIS — R21 Rash and other nonspecific skin eruption: Secondary | ICD-10-CM

## 2012-06-20 DIAGNOSIS — R480 Dyslexia and alexia: Secondary | ICD-10-CM

## 2012-06-20 DIAGNOSIS — F909 Attention-deficit hyperactivity disorder, unspecified type: Secondary | ICD-10-CM

## 2012-06-20 LAB — TSH: TSH: 3.978 u[IU]/mL (ref 0.350–4.500)

## 2012-06-20 LAB — COMPLETE METABOLIC PANEL WITH GFR
ALT: 91 U/L — ABNORMAL HIGH (ref 0–53)
AST: 57 U/L — ABNORMAL HIGH (ref 0–37)
Alkaline Phosphatase: 61 U/L (ref 39–117)
Creat: 1.15 mg/dL (ref 0.50–1.35)
Total Bilirubin: 0.4 mg/dL (ref 0.3–1.2)

## 2012-06-20 LAB — MAGNESIUM: Magnesium: 2 mg/dL (ref 1.5–2.5)

## 2012-06-20 MED ORDER — METHYLPHENIDATE HCL ER (OSM) 27 MG PO TBCR: 27.0000 mg | EXTENDED_RELEASE_TABLET | ORAL | Status: DC

## 2012-06-20 NOTE — Progress Notes (Signed)
  Subjective:    Patient ID: Ronnie Martin, male    DOB: Dec 20, 1955, 56 y.o.   MRN: [id number]  HPI ADHD - Was pon adderral a couple of years ago but only took it for a few months and stopped it bc of problems getting the medication. He also says that the amphetamine salts increase his blood pressure..  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  He has been cutting people off in conversation. No prior history of cardiac problems. No recent chest pain or short of breath. He also has a history of dyslexia and says that when he gets more frustrated and annoyed it interferes more. He did have some sleep disturbance when he took his Adderall previously. 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  He has complained of this to Ronnie Martin who is his surgeon. Ronnie Martin have told him that if they were still present within a year after his surgery that they would likely stay permanently. He says it can take up to 5 minutes after it spasms for her to calm down to the point where he is no longer in pain. Says getting muscle spasms in his feet more recently.  Says stried to stay hydrated.   Right axilla with turn red and will burn while working. Will use a Gold bond powder on it and the next day is better. Only under the right. Has been going on for years.  Then will look like a dark ring.     Review of Systems     Objective:   Physical Exam  Constitutional: He is oriented to person, place, and time. He appears well-developed and well-nourished.  HENT:  Head: Normocephalic and atraumatic.  Cardiovascular: Normal rate, regular rhythm and normal heart sounds.   Pulmonary/Chest: Effort normal and breath sounds normal.  Neurological: He is alert and oriented to person, place, and time.  Skin: Skin is warm and dry.       Skin  under the right axilla is hyperpigmented and dry and scaling. No erythema. No cracking or drainage.  Psychiatric: He has a normal mood and affect. His behavior is normal.          Assessment & Plan:  ADHD-we discussed different options. He did have a blood pressure rise when he was on Adderall previously. We will try  Concerta and followup in one month to recheck blood pressure and see if she's tolerating it well. Stop immediately if any chest pain or shortness of breath. I did encourage him to check his blood pressure at work a couple of times between now and his followup if he is able to. We will monitor his blood pressure, weight and sleep.  Muscle cramps-unclear etiology at this point we will check some blood work to rule out thyroid disorder or electrolyte disturbance. We will also check a CK and make sure that underlying degenerative muscle condition. Consider muscle relaxer if blood work is normal.  Rash in the right axilla-KOH skin scraping collected today. Most likely fungal. We'll treat based on KOH.

## 2012-06-20 NOTE — Addendum Note (Signed)
Addended by: Wyline Beady on: 06/20/2012 04:35 PM   Modules accepted: Orders

## 2012-06-23 ENCOUNTER — Other Ambulatory Visit: Payer: Self-pay | Admitting: Family Medicine

## 2012-06-23 MED ORDER — ERYTHROMYCIN 2 % EX SOLN: Freq: Every day | CUTANEOUS | Status: DC

## 2012-06-26 ENCOUNTER — Other Ambulatory Visit: Payer: Self-pay | Admitting: Family Medicine

## 2012-06-27 ENCOUNTER — Other Ambulatory Visit: Payer: Self-pay | Admitting: *Deleted

## 2012-06-27 ENCOUNTER — Telehealth: Payer: Self-pay | Admitting: *Deleted

## 2012-06-27 MED ORDER — ESOMEPRAZOLE MAGNESIUM 40 MG PO CPDR: 40.0000 mg | DELAYED_RELEASE_CAPSULE | Freq: Every day | ORAL | Status: DC

## 2012-06-27 NOTE — Telephone Encounter (Signed)
Pt calls and took his first Concerta today and BP is 135/91. States it jumped up by 20 points. He would like to continue to take the concerta if possible and wants to know what you think and if BP meds need increasing

## 2012-06-27 NOTE — Telephone Encounter (Signed)
He can increase his metoprolol to 2 tabs and see if helps balance the BP.

## 2012-06-28 NOTE — Telephone Encounter (Signed)
Patient notiifed

## 2012-06-29 ENCOUNTER — Telehealth: Payer: Self-pay | Admitting: *Deleted

## 2012-06-29 NOTE — Telephone Encounter (Signed)
Pt has called stating that he has been taking 2 BP pills as instructed and his BP is still running high. States he took a half of adderal and 2 BP tabs and his BP is still 140's/90's. Please advise.

## 2012-06-29 NOTE — Telephone Encounter (Signed)
Would he be open to trying a non- stimulant for his ADD called Strattera.

## 2012-06-30 MED ORDER — ATOMOXETINE HCL 40 MG PO CAPS: ORAL_CAPSULE | ORAL | Status: DC

## 2012-06-30 NOTE — Telephone Encounter (Signed)
Pt informed med sent

## 2012-06-30 NOTE — Telephone Encounter (Signed)
Pt states that he would like to try the Strattera.

## 2012-06-30 NOTE — Telephone Encounter (Signed)
LMOM

## 2012-06-30 NOTE — Telephone Encounter (Signed)
Prescriptions with a long outpatient pharmacy. It may require prior office so I recommend he check next week at the pharmacy to see if it has been filled.

## 2012-07-26 ENCOUNTER — Ambulatory Visit: Payer: 59 | Admitting: Family Medicine

## 2012-09-14 ENCOUNTER — Other Ambulatory Visit: Payer: Self-pay | Admitting: Family Medicine

## 2012-09-25 ENCOUNTER — Emergency Department (HOSPITAL_COMMUNITY)
Admission: EM | Admit: 2012-09-25 | Discharge: 2012-09-25 | Disposition: A | Discharge status: Home / Self Care | Payer: 59 | Source: Home / Self Care | Attending: Family Medicine | Admitting: Family Medicine

## 2012-09-25 ENCOUNTER — Encounter (HOSPITAL_COMMUNITY): Payer: Self-pay

## 2012-09-25 DIAGNOSIS — S058X9A Other injuries of unspecified eye and orbit, initial encounter: Secondary | ICD-10-CM

## 2012-09-25 DIAGNOSIS — S0502XA Injury of conjunctiva and corneal abrasion without foreign body, left eye, initial encounter: Secondary | ICD-10-CM

## 2012-09-25 MED ORDER — TOBRAMYCIN 0.3 % OP OINT
TOPICAL_OINTMENT | OPHTHALMIC | Status: AC
  Filled 2012-09-25: qty 3.5

## 2012-09-25 MED ORDER — BACITRACIN-POLYMYXIN B 500-10000 UNIT/GM OP OINT: TOPICAL_OINTMENT | Freq: Two times a day (BID) | OPHTHALMIC | Status: DC

## 2012-09-25 MED ORDER — TETRACAINE HCL 0.5 % OP SOLN
OPHTHALMIC | Status: AC
  Filled 2012-09-25: qty 2

## 2012-09-25 NOTE — ED Notes (Signed)
Patient states that his dog jumped up and scratched his left eye this morning

## 2012-09-25 NOTE — ED Notes (Signed)
T/C from [facility] Arlys John [phone]).  Prescribed oint not available, and pt prefers drops.  Dr. Tressia Danas unavailable; V.O. Dr. Artis Flock: may use equivalent drops (per pharm, Polytrim); 1 gtt left eye q 4 hrs x 5 days.

## 2012-09-26 NOTE — ED Provider Notes (Signed)
History     CSN: [id number]  Arrival date & time 09/25/12  1443   First MD Initiated Contact with Patient 09/25/12 1503      Chief Complaint  Patient presents with  . Eye Problem    (Consider location/radiation/quality/duration/timing/severity/associated sxs/prior treatment) HPI Comments: 56 y/o male here c/o left eye photophobia, tearing since am after his small dog scratched his eye with his paw/nail while playing this morning. Denies visual changes although vision is difficulted by patient being unable to keep left eye open. (patient works as Water quality scientist at [employer]. Patient wears glasses and does not wear contacts currently. Used a expired eye drop moisturizer he had leftover at home with no relief.    Past Medical History  Diagnosis Date  . Hypertension   . Carcinoma of lung   . Hepatitis C   . GERD (gastroesophageal reflux disease)   . Diverticulosis of colon   . Colon polyps     history of polyps    Past Surgical History  Procedure Date  . Forearm surgery   . Hip surgery 2006    right hip  . Lung lobectomy 11/24/06  . Total hip revision 04/2011    Had high colbalt levels.     Family History  Problem Relation Age of Onset  . Rectal cancer Father     History  Substance Use Topics  . Smoking status: Former Smoker    Types: Cigarettes    Quit date: 12/06/2009  . Smokeless tobacco: Not on file  . Alcohol Use: No      Review of Systems  HENT: Negative for facial swelling.   Eyes: Positive for photophobia, pain and redness.       As per HPI  Skin: Negative for wound.  All other systems reviewed and are negative.    Allergies  Codeine and Concerta  Home Medications   Current Outpatient Rx  Name Route Sig Dispense Refill  . ASPIRIN 81 MG PO TABS Oral Take 81 mg by mouth daily.      . IBUPROFEN 200 MG PO TABS Oral Take 600 mg by mouth every 6 (six) hours as needed.      Marland Kitchen METOPROLOL SUCCINATE ER 25 MG PO TB24 Oral Take 1 tablet (25 mg total)  by mouth daily. 90 tablet 0  . MULTIVITAMINS PO TABS Oral Take 1 tablet by mouth daily.      Marland Kitchen NEXIUM 40 MG PO CPDR  TAKE 1 CAPSULE BY MOUTH DAILY 30 capsule 2  . FISH OIL PO Oral Take 1 capsule by mouth.      . TESTOSTERONE 20.25 MG/1.25GM (1.62%) TD GEL Transdermal Place 3 Act onto the skin daily. 3.75 g 1  . BACITRACIN-POLYMYXIN B 500-10000 UNIT/GM OP OINT Left Eye Place into the left eye every 12 (twelve) hours. apply to eye every 12 hours. 3.5 g 0    BP 143/87  Pulse 91  Temp 97.9 F (36.6 C) (Oral)  Resp 18  SpO2 96%  Physical Exam  Nursing note and vitals reviewed. Constitutional: He is oriented to person, place, and time. He appears well-developed and well-nourished. No distress.  HENT:  Head: Normocephalic and atraumatic.  Eyes: EOM and lids are normal. Pupils are equal, round, and reactive to light. No foreign bodies found. Right eye exhibits no chemosis, no discharge and no exudate. No foreign body present in the right eye. Left eye exhibits no chemosis, no discharge and no exudate. No foreign body present in the left eye. Right  conjunctiva is not injected. Right conjunctiva has no hemorrhage. Left conjunctiva is injected. Left conjunctiva has no hemorrhage. No scleral icterus.  Slit lamp exam:      The left eye shows corneal abrasion and fluorescein uptake. The left eye shows no hyphema and no hypopyon.         Fluorescein staining as per diagram. No signs of perforation or deep corneal lacerations.  Cardiovascular: Normal rate, regular rhythm and normal heart sounds.   Pulmonary/Chest: Effort normal and breath sounds normal.  Neurological: He is alert and oriented to person, place, and time.  Skin: He is not diaphoretic.       No lacerations  Psychiatric: He has a normal mood and affect. His behavior is normal. Judgment and thought content normal.    ED Course  Procedures (including critical care time)  Labs Reviewed - No data to display No results found.   1.  Corneal abrasion, left       MDM  Patient with corneal abrasion caused by his pet dog nail scratch while playing, case discussed with ophthalmologist on call Dr. Clarisa Kindred. She favored patching eye and Polytrim ointment until follow up in her office. Dr. Anne Hahn office referral provided. Patient instructions discussed and provided in writing.         Sharin Grave, MD 09/26/12 1356

## 2012-10-04 ENCOUNTER — Encounter: Payer: Self-pay | Admitting: Family Medicine

## 2012-10-04 ENCOUNTER — Ambulatory Visit (INDEPENDENT_AMBULATORY_CARE_PROVIDER_SITE_OTHER): Payer: 59 | Admitting: Family Medicine

## 2012-10-04 VITALS — BP 122/76 | HR 120 | Temp 97.7°F | Wt 213.0 lb

## 2012-10-04 DIAGNOSIS — J020 Streptococcal pharyngitis: Secondary | ICD-10-CM

## 2012-10-04 DIAGNOSIS — I10 Essential (primary) hypertension: Secondary | ICD-10-CM

## 2012-10-04 MED ORDER — PENICILLIN V POTASSIUM 500 MG PO TABS: ORAL_TABLET | ORAL | Status: DC

## 2012-10-04 NOTE — Progress Notes (Signed)
CC: Ronnie Martin is a 56 y.o. male is here for Sore Throat   Subjective: HPI:  Two days sore throat, present only when swallowing, not influenced by movement of neck. Associated with fatigue but no other complaints. Using ibuprophen, salt water gargle, warm honey fluids, cough drops but no improvement.  Present all hours of the day.  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  Denies fevers, chills, nausea, vomiting, rashes, swelling of neck, chest pain, sob, orthopnea, nasal congestion, facial pain, ear pain, nor headache.  He's concerned of elevated blood pressures outside of our office, he's noticed on multiple occasions diastolics above 90 below 100. He's taking metoprolol not miss doses. Denies motor sensory disturbances, chest pain, shortness of breath, regular heartbeat, nor peripheral edema nor orthopnea.  Review Of Systems Outlined In HPI  Past Medical History  Diagnosis Date  . Hypertension   . Carcinoma of lung   . Hepatitis C   . GERD (gastroesophageal reflux disease)   . Diverticulosis of colon   . Colon polyps     history of polyps     Family History  Problem Relation Age of Onset  . Rectal cancer Father      History  Substance Use Topics  . Smoking status: Former Smoker    Types: Cigarettes    Quit date: 12/06/2009  . Smokeless tobacco: Not on file  . Alcohol Use: No     Objective: Filed Vitals:   10/04/12 1051  BP: 122/76  Pulse: 120  Temp:     General: Alert and Oriented, No Acute Distress HEENT: Pupils equal, round, reactive to light. Conjunctivae clear.  External ears unremarkable, canals clear with intact TMs with appropriate landmarks.  Middle ear appears open without effusion. Pink inferior turbinates.  Moist mucous membranes, erythematous posterior pharynx, speckled white plaques on the tonsils, neck supple without palpable lymphadenopathy nor abnormal masses. Lungs: Clear to auscultation bilaterally, no  wheezing/ronchi/rales.  Comfortable work of breathing. Good air movement. Cardiac: Regular rate and rhythm. Normal S1/S2.  No murmurs, rubs, nor gallops.   Abdomen: Normal bowel sounds, soft and non tender without palpable masses.  Assessment & Plan: Lean was seen today for sore throat.  Diagnoses and associated orders for this visit:  Strep pharyngitis - penicillin v potassium (VEETID) 500 MG tablet; One by mouth every 12 hours for ten days, take 1 hour before or 2 hours after meals.  Hypertension    Suspicious of strep pharyngitis given exam, start penicillin. Continue with current symptomatically control listed in history of present illness.   Repeat blood pressure was normotensive after patient rested. 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

## 2012-10-06 ENCOUNTER — Telehealth: Payer: Self-pay | Admitting: *Deleted

## 2012-10-06 MED ORDER — PREDNISONE 20 MG PO TABS: ORAL_TABLET | ORAL | Status: DC

## 2012-10-06 NOTE — Telephone Encounter (Signed)
Pt calls stating seen you and put on antibiotic for strep throat- on 3rd day of antibiotic and throat is extremely sore doesn't feel any better. Questions if needs med changed or keep taking and ride it out. Please advise

## 2012-10-06 NOTE — Telephone Encounter (Signed)
Selena Batten will you please ask him to continue with his antibiotic and can you call in the order for prednisone associated with this phone note to his pharmacy of choice, it should help with the inflammation ~24 hours after starting the regimen. Thank you

## 2012-10-06 NOTE — Telephone Encounter (Signed)
Pt notified via VM and sent med to pharmacy

## 2012-10-10 ENCOUNTER — Ambulatory Visit (INDEPENDENT_AMBULATORY_CARE_PROVIDER_SITE_OTHER): Payer: 59 | Admitting: Family Medicine

## 2012-10-10 ENCOUNTER — Encounter: Payer: Self-pay | Admitting: Family Medicine

## 2012-10-10 VITALS — BP 137/77 | HR 59 | Temp 97.9°F | Wt 215.0 lb

## 2012-10-10 DIAGNOSIS — J039 Acute tonsillitis, unspecified: Secondary | ICD-10-CM

## 2012-10-10 MED ORDER — AMOXICILLIN-POT CLAVULANATE 500-125 MG PO TABS: ORAL_TABLET | ORAL | Status: AC

## 2012-10-10 MED ORDER — PREDNISONE 20 MG PO TABS: ORAL_TABLET | ORAL | Status: AC

## 2012-10-10 NOTE — Progress Notes (Signed)
CC: Ronnie Martin is a 56 y.o. male is here for Sore Throat   Subjective: HPI:  CC: Ronnie Martin is a 56 y.o. male is here for Sore Throat   Subjective: HPI:  Patient was seen last week for suspected strep pharyngitis. He has had days of a sore throat is present only when swallowing never with movement of the neck. Symptoms are worse first thing in the morning and improves throughout the day but are bothersome regardless of the hour. He's been using ibuprofen without improvement. Hot drinks without improvement. Nothing seems to make the symptoms worse. Associated with bloody discharge out of the right nostril this morning. He denies fevers, chills, facial pressure, dizziness, cough, shortness of breath, chest pain, nor change in voice. Denies choking or lack of appetite. Has not missed any penicillin doses   Review Of Systems Outlined In HPI  Past Medical History  Diagnosis Date  . Hypertension   . Carcinoma of lung   . Hepatitis C   . GERD (gastroesophageal reflux disease)   . Diverticulosis of colon   . Colon polyps     history of polyps     Family History  Problem Relation Age of Onset  . Rectal cancer Father      History  Substance Use Topics  . Smoking status: Former Smoker    Types: Cigarettes    Quit date: 12/06/2009  . Smokeless tobacco: Not on file  . Alcohol Use: No     Objective: Filed Vitals:   10/10/12 0941  BP: 137/77  Pulse: 59  Temp: 97.9 F (36.6 C)    General: Alert and Oriented, No Acute Distress HEENT: Pupils equal, round, reactive to light. Conjunctivae clear.  External ears unremarkable, canals clear with intact TMs with appropriate landmarks.  Middle ear appears open without effusion. Pink inferior turbinates.  Moist mucous membranes, pharynx with moderate erythema and moderate erythema and a bilateral tonsils with a midline uvula.  Neck supple without palpable lymphadenopathy nor abnormal masses. Lungs: Clear to auscultation bilaterally,  no wheezing/ronchi/rales.  Comfortable work of breathing. Good air movement. Cardiac: Regular rate and rhythm. Normal S1/S2.  No  murmurs  Mental Status: No depression, anxiety, nor agitation. Skin: Warm and dry.  Assessment & Plan: Ronnie Martin was seen today for sore throat.  Diagnoses and associated orders for this visit:  Tonsillitis - predniSONE (DELTASONE) 20 MG tablet; Three tabs at once daily for five days. - amoxicillin-clavulanate (AUGMENTIN) 500-125 MG per tablet; Take one by mouth every 8 hours for ten total days.    He did not get our message about prednisone, I would still like him to start this. No evidence of peritonsillar abscess. We'll step up and about therapy. I've asked him to return or let it is not improved by Thursday afternoon.  Return if symptoms worsen or fail to improve.

## 2012-10-11 IMAGING — CR DG CHEST 2V
2 series · 2 of 2 positions shown · non-contrast
Comparison: Chest x-ray of 01/20/2011 and CT chest of 08/17/2011

CLINICAL DATA: Shortness of breath, chest pain, history of prior
right lung surgery

CHEST - 2 VIEW

[view not recorded (1 of 2)]
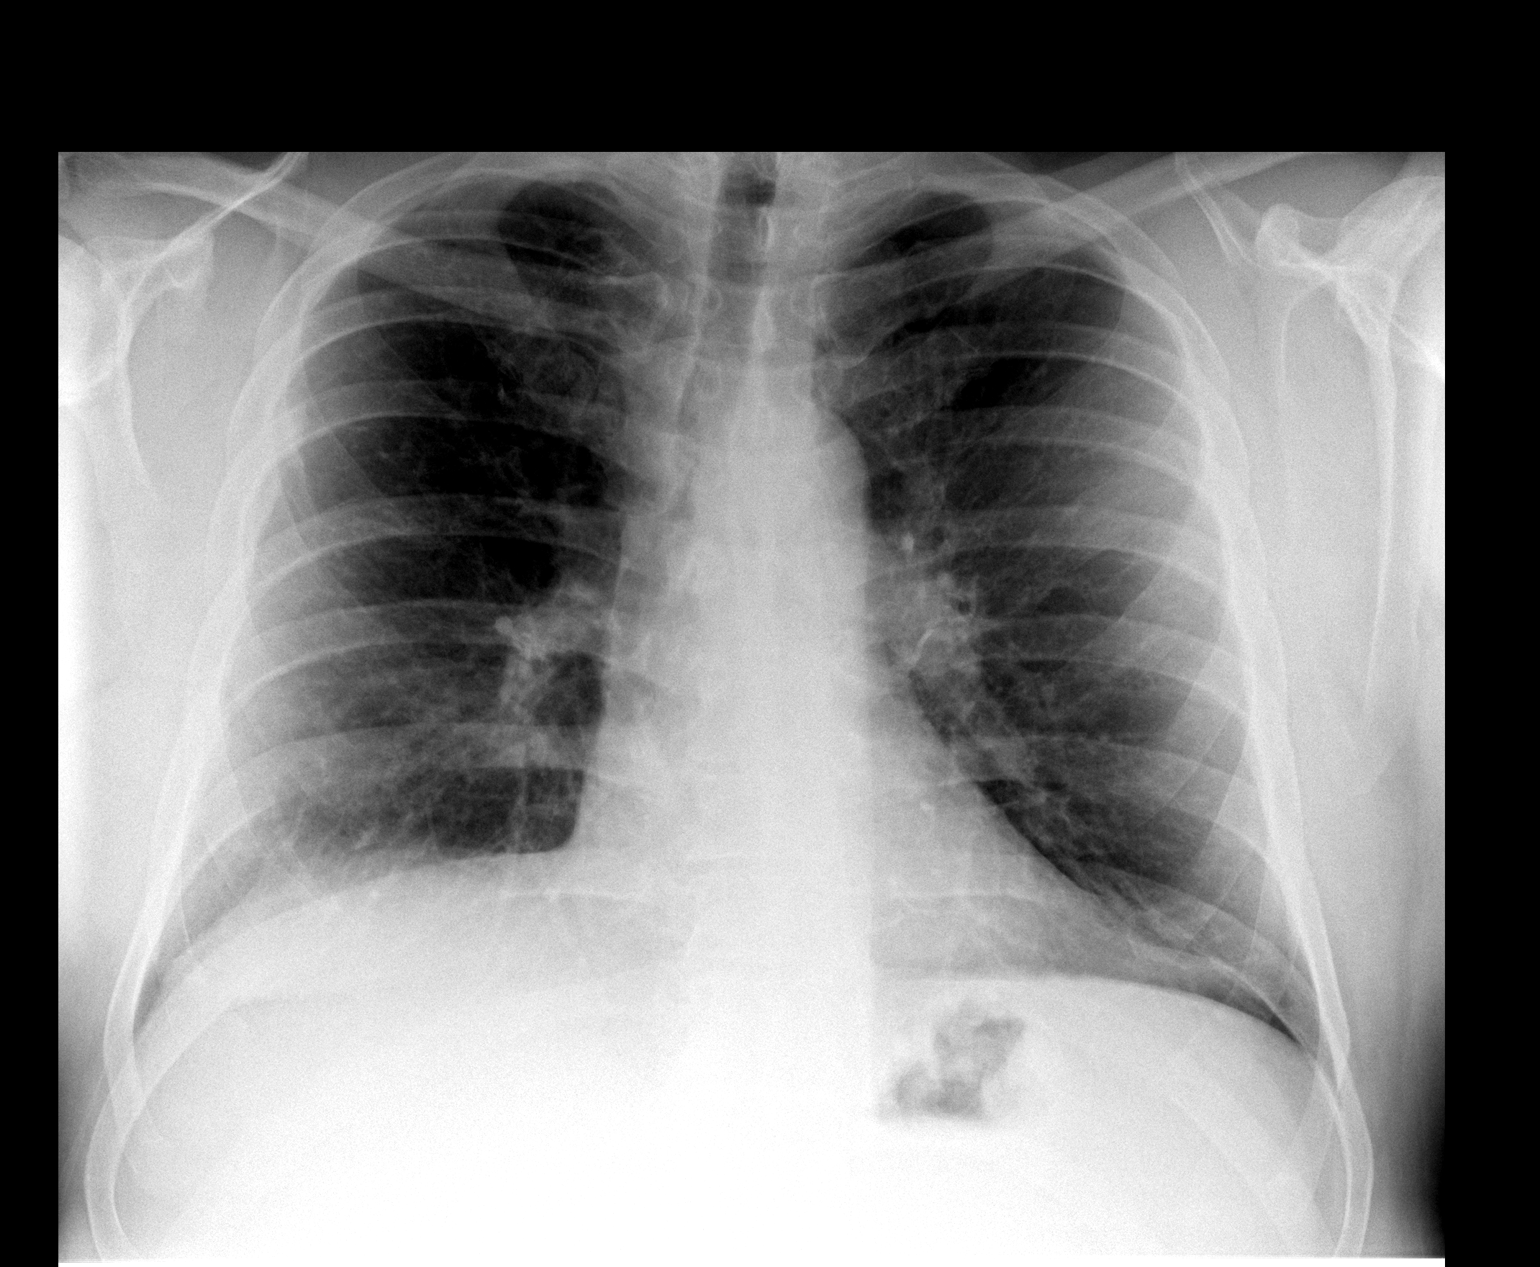

[view not recorded (2 of 2)]
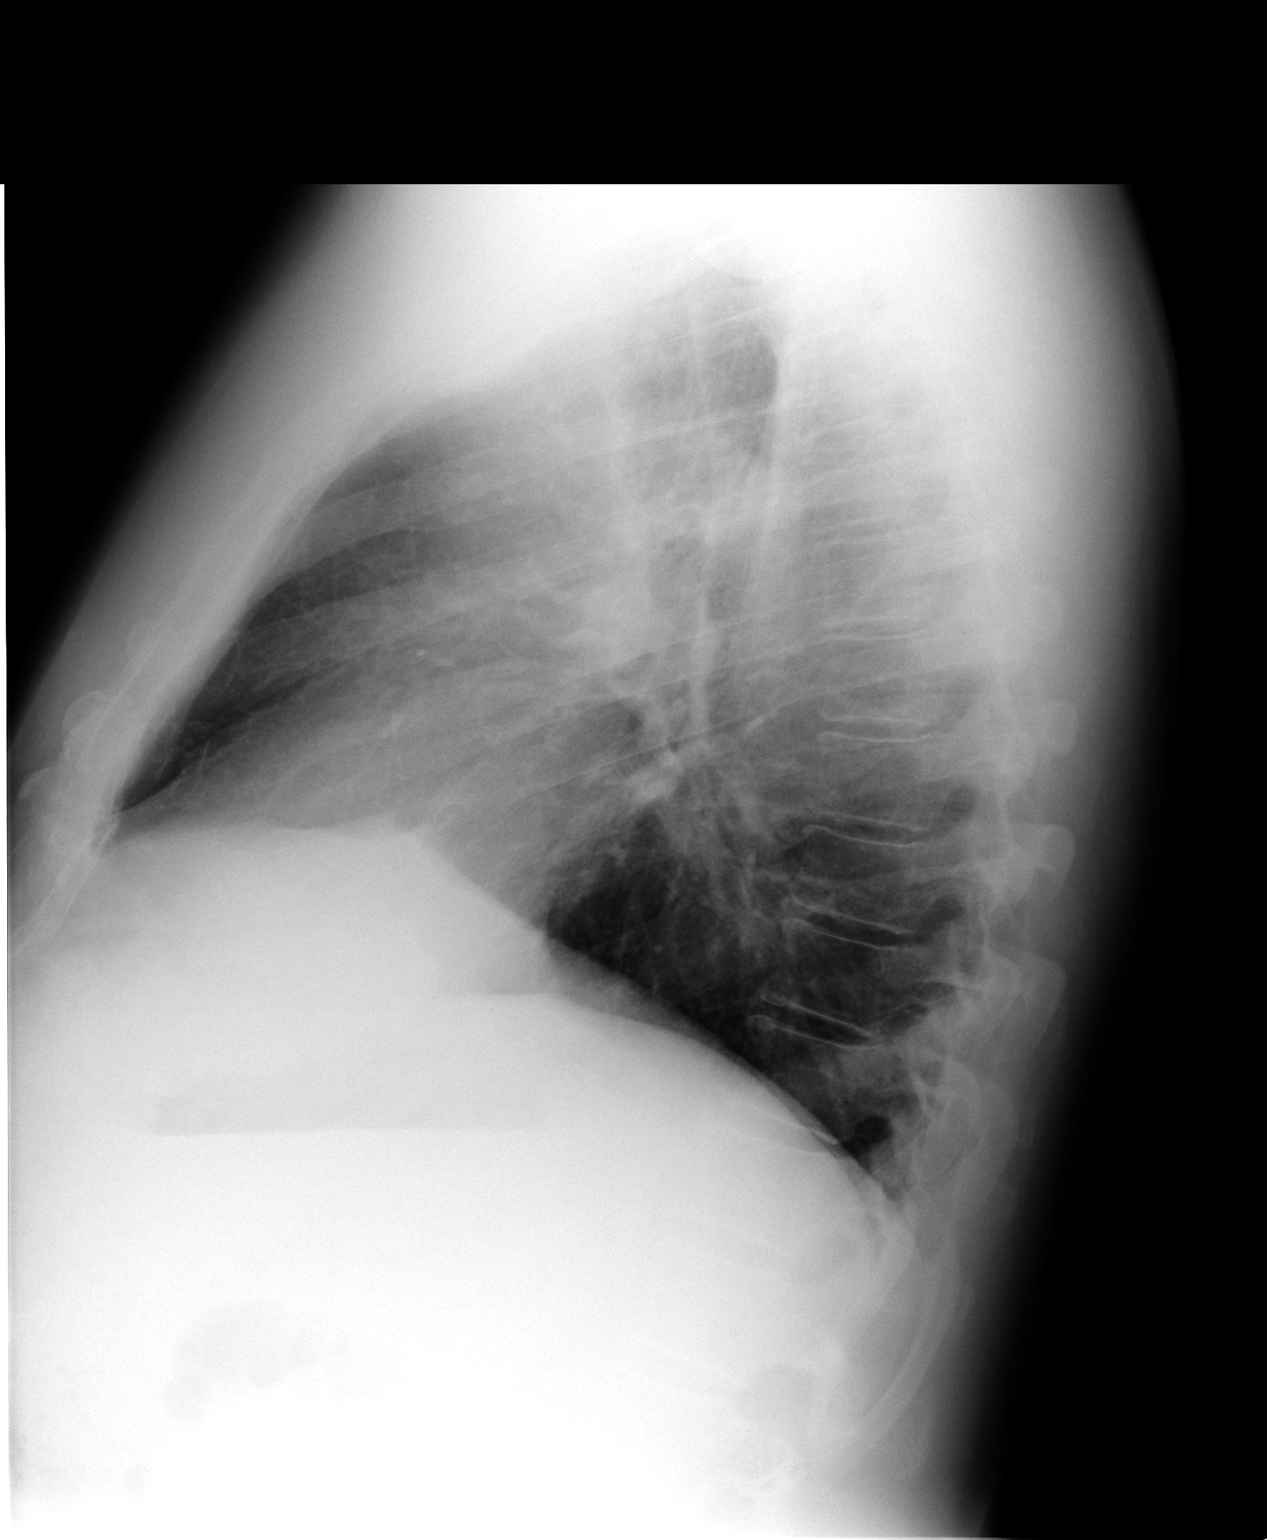

[2 of 2 positions shown; findings below may reference images not displayed]

FINDINGS: No active infiltrate or effusion is seen.  Minimal
scarring in the periphery of the right upper lung field is stable.
Mediastinal contours are stable.  The heart is within normal limits
in size.  No bony abnormality is seen.
IMPRESSION: Stable chest x-ray.  No active lung disease.

## 2012-10-12 ENCOUNTER — Telehealth: Payer: Self-pay | Admitting: *Deleted

## 2012-10-12 NOTE — Telephone Encounter (Signed)
Pt notified of MD instructions. KG LPN 

## 2012-10-12 NOTE — Telephone Encounter (Signed)
Pt calls states you put him on augmentin and prednisone pack for 5 days. Last few nights his BP has been elevated at 140/90 and last night 159/101, face flushed, feels like heart beating out of chest. On 2 BP meds already so questions what he needs to do. Please advise

## 2012-10-12 NOTE — Telephone Encounter (Signed)
This is most likely due to the prednisdone, I'd encourage him to take two of his metoprolol succinate 25mg  tablets at the same time daily while on prednisone and for only one day after he stops the prednisone.  Then go back to the usual one metoprolol succinate 25mg  tablets daily.

## 2012-10-30 ENCOUNTER — Other Ambulatory Visit: Payer: Self-pay | Admitting: *Deleted

## 2012-10-30 MED ORDER — TESTOSTERONE 20.25 MG/1.25GM (1.62%) TD GEL: 3.0000 | Freq: Every day | TRANSDERMAL | Status: DC

## 2012-10-31 ENCOUNTER — Ambulatory Visit: Payer: Commercial Managed Care - PPO | Admitting: Family Medicine

## 2012-11-10 ENCOUNTER — Telehealth: Payer: Self-pay

## 2012-11-10 MED ORDER — METOPROLOL SUCCINATE ER 50 MG PO TB24: 50.0000 mg | ORAL_TABLET | Freq: Every day | ORAL | Status: DC

## 2012-11-10 NOTE — Telephone Encounter (Signed)
Ronnie Martin called: He states the increased dose on his blood pressure medication is keeping his blood pressure well controlled. Dr Ivan Anchors increased it due to the elevated blood pressure he was having while on prednisone. He has been off the prednisone and the blood pressure is still remaining in the 120/80'S. He would like to continue taking the increased dose. He wants a revised blood pressure medication sent to the [facility] outpatient pharmacy.

## 2012-11-10 NOTE — Telephone Encounter (Signed)
Ok, new rx sent for toprol 50

## 2013-01-05 ENCOUNTER — Other Ambulatory Visit: Payer: Self-pay | Admitting: Family Medicine

## 2013-01-05 MED ORDER — PANTOPRAZOLE SODIUM 40 MG PO TBEC: 40.0000 mg | DELAYED_RELEASE_TABLET | Freq: Every day | ORAL | Status: DC

## 2013-01-16 ENCOUNTER — Telehealth: Payer: Self-pay | Admitting: *Deleted

## 2013-01-16 DIAGNOSIS — E291 Testicular hypofunction: Secondary | ICD-10-CM

## 2013-01-16 DIAGNOSIS — E785 Hyperlipidemia, unspecified: Secondary | ICD-10-CM

## 2013-01-16 DIAGNOSIS — I10 Essential (primary) hypertension: Secondary | ICD-10-CM

## 2013-01-16 NOTE — Telephone Encounter (Signed)
Pt notified and labs faxed. KG LPN

## 2013-01-16 NOTE — Telephone Encounter (Signed)
Pt calls and wants to get his labs done in the morning fasting, What labs do you want him to have? Wants to make appt with you on Friday as well

## 2013-01-16 NOTE — Telephone Encounter (Signed)
Cmp. Lipid, psa please

## 2013-01-20 ENCOUNTER — Other Ambulatory Visit: Payer: Self-pay

## 2013-01-24 ENCOUNTER — Other Ambulatory Visit: Payer: Self-pay | Admitting: Family Medicine

## 2013-01-24 DIAGNOSIS — Z77018 Contact with and (suspected) exposure to other hazardous metals: Secondary | ICD-10-CM

## 2013-01-24 LAB — TESTOSTERONE: Testosterone: 551 ng/dL (ref 300–890)

## 2013-01-24 LAB — PSA: PSA: 0.53 ng/mL (ref <=4.00)

## 2013-01-24 LAB — COMPLETE METABOLIC PANEL WITH GFR
AST: 57 U/L — ABNORMAL HIGH (ref 0–37)
BUN: 22 mg/dL (ref 6–23)
Calcium: 9.9 mg/dL (ref 8.4–10.5)
Chloride: 103 mEq/L (ref 96–112)
Creat: 1.19 mg/dL (ref 0.50–1.35)
Total Bilirubin: 0.9 mg/dL (ref 0.3–1.2)

## 2013-01-24 LAB — LIPID PANEL
Cholesterol: 191 mg/dL (ref 0–200)
HDL: 38 mg/dL — ABNORMAL LOW (ref >39)
Total CHOL/HDL Ratio: 5 Ratio
Triglycerides: 98 mg/dL (ref <150)
VLDL: 20 mg/dL (ref 0–40)

## 2013-01-28 LAB — COBALT: Cobalt, Plasma: 0.9 mcg/L (ref <1.9)

## 2013-01-30 ENCOUNTER — Ambulatory Visit: Payer: 59 | Admitting: Family Medicine

## 2013-01-31 MED ORDER — ALBUTEROL SULFATE HFA 108 (90 BASE) MCG/ACT IN AERS: 2.0000 | INHALATION_SPRAY | Freq: Four times a day (QID) | RESPIRATORY_TRACT | Status: DC | PRN

## 2013-02-01 ENCOUNTER — Encounter: Payer: Self-pay | Admitting: Family Medicine

## 2013-02-01 ENCOUNTER — Ambulatory Visit (INDEPENDENT_AMBULATORY_CARE_PROVIDER_SITE_OTHER): Payer: 59 | Admitting: Family Medicine

## 2013-02-01 VITALS — BP 163/94 | HR 62 | Ht 71.0 in | Wt 207.0 lb

## 2013-02-01 DIAGNOSIS — I10 Essential (primary) hypertension: Secondary | ICD-10-CM

## 2013-02-01 DIAGNOSIS — B182 Chronic viral hepatitis C: Secondary | ICD-10-CM

## 2013-02-01 DIAGNOSIS — E291 Testicular hypofunction: Secondary | ICD-10-CM

## 2013-02-01 MED ORDER — METOPROLOL SUCCINATE ER 100 MG PO TB24: 100.0000 mg | ORAL_TABLET | Freq: Every day | ORAL | Status: DC

## 2013-02-01 MED ORDER — TESTOSTERONE 20.25 MG/1.25GM (1.62%) TD GEL: 3.0000 | Freq: Every day | TRANSDERMAL | Status: DC

## 2013-02-01 NOTE — Progress Notes (Signed)
  Subjective:    Patient ID: Ronnie Martin, male    DOB: 10-12-1956, 57 y.o.   MRN: [id number]  HPI HTN -  Pt denies chest pain, SOB, dizziness, or heart palpitations.  Taking meds as directed w/o problems.  Denies medication side effects.  He notices his face is been more flushed and feels like his blood pressures been high. No headaches. No dizziness. No swelling.  Hypogonadism-doing well on the AndroGel. Her palms or complaints or side effects the medication. He did have his testosterone levels recently checked on like to go over those today.  Hepatitis C-related recheck his liver enzymes. He's not having any abdominal pain or swelling. He has not seen a hepatitis C specialist in well over 10 years.  Review of Systems     Objective:   Physical Exam  Constitutional: He is oriented to person, place, and time. He appears well-developed and well-nourished.  HENT:  Head: Normocephalic and atraumatic.  Cardiovascular: Normal rate, regular rhythm and normal heart sounds.   Pulmonary/Chest: Effort normal and breath sounds normal.  Neurological: He is alert and oriented to person, place, and time.  Skin: Skin is warm and dry.  Psychiatric: He has a normal mood and affect. His behavior is normal.          Assessment & Plan:  HTN- Un controlled.  Increase metoprolol 200 mg extended release once a day. He prefers to try the extended release and he says he often forgets to take a second dose. Followup to recheck blood pressure in 6-8 weeks. Make sure watching low salt diet and getting regular exercise.  Hypogonadism-doing well on AndroGel-testosterone level looks fantastic. PSA is stable. Repeat blood work in 4-6 months.  Hepatitis C-his liver enzymes are persistently elevated. They have not changed dramatically but I explained to him that there is some concern with long-term hepatitis C and eventually developing cirrhosis of the liver. I would like to refer him to the hepatitis C clinic in  [place] for further evaluation. Even if they just monitor him yearly and we are keeping an eye on his liver I think this is most important.

## 2013-02-01 NOTE — Patient Instructions (Signed)

## 2013-02-09 ENCOUNTER — Other Ambulatory Visit: Payer: Self-pay | Admitting: Family Medicine

## 2013-02-14 ENCOUNTER — Other Ambulatory Visit: Payer: Self-pay | Admitting: *Deleted

## 2013-02-14 MED ORDER — ALBUTEROL SULFATE HFA 108 (90 BASE) MCG/ACT IN AERS: INHALATION_SPRAY | RESPIRATORY_TRACT | Status: DC

## 2013-02-19 ENCOUNTER — Telehealth: Payer: Self-pay

## 2013-02-19 NOTE — Telephone Encounter (Signed)
Ronnie Martin is calling in reference to the referral to the Hep C clinic. Will they call him or will we?

## 2013-02-27 ENCOUNTER — Telehealth: Payer: Self-pay | Admitting: *Deleted

## 2013-02-27 NOTE — Telephone Encounter (Signed)
Tara [facility] calls & states that we had to change his quantity to 4 bottles.  i gave the verbal ok.

## 2013-03-12 ENCOUNTER — Encounter: Payer: Self-pay | Admitting: Family Medicine

## 2013-03-12 ENCOUNTER — Telehealth: Payer: Self-pay | Admitting: Family Medicine

## 2013-03-12 ENCOUNTER — Ambulatory Visit (INDEPENDENT_AMBULATORY_CARE_PROVIDER_SITE_OTHER): Payer: 59 | Admitting: Family Medicine

## 2013-03-12 VITALS — BP 144/88 | HR 71 | Wt 207.0 lb

## 2013-03-12 DIAGNOSIS — I10 Essential (primary) hypertension: Secondary | ICD-10-CM

## 2013-03-12 DIAGNOSIS — B182 Chronic viral hepatitis C: Secondary | ICD-10-CM

## 2013-03-12 DIAGNOSIS — R748 Abnormal levels of other serum enzymes: Secondary | ICD-10-CM

## 2013-03-12 DIAGNOSIS — R0602 Shortness of breath: Secondary | ICD-10-CM

## 2013-03-12 MED ORDER — LISINOPRIL 40 MG PO TABS: 40.0000 mg | ORAL_TABLET | Freq: Every day | ORAL | Status: DC

## 2013-03-12 MED ORDER — METOPROLOL SUCCINATE ER 50 MG PO TB24: 50.0000 mg | ORAL_TABLET | Freq: Every day | ORAL | Status: DC

## 2013-03-12 NOTE — Patient Instructions (Addendum)
Work on low salt diet Work on 10 lb  Weight loss.

## 2013-03-12 NOTE — Progress Notes (Signed)
  Subjective:    Patient ID: Ronnie Martin, male    DOB: August 27, 1956, 57 y.o.   MRN: [id number]  HPI HTN- Pt denies chest pain, SOB, dizziness, or heart palpitations.  Taking meds as directed w/o problems.  Denies medication side effects. Gets more SOB with activities than he thinks he should. He does not expensing chest pain with this but does worry about coronary artery disease. No strong family history. Says feels more fatigued on the inc dose of metoprolol.  He feels like he doesn't want to work out.    Review of Systems     Objective:   Physical Exam  Constitutional: He is oriented to person, place, and time. He appears well-developed and well-nourished.  HENT:  Head: Normocephalic and atraumatic.  Cardiovascular: Normal rate, regular rhythm and normal heart sounds.   Pulmonary/Chest: Effort normal and breath sounds normal.  Musculoskeletal: He exhibits no edema.  Neurological: He is alert and oriented to person, place, and time.  Skin: Skin is warm and dry.  Psychiatric: He has a normal mood and affect. His behavior is normal.          Assessment & Plan:  HTN - work on 10 lbs weight loss.  WIll dec dose of toprol to 50mg , since he felt more tired and sluggish on the 100 mg dose.  Then add ACE for better control. Followup in 2 months to recheck blood pressure in 2 to him time to work on weight loss.  SOB- Will refer to cardiology for Stress test. Last saw Dr. Liliane Bade about 4 yr ao.    Elevated liver enzymes-I. would still like to get an ultrasound of the liver.

## 2013-03-12 NOTE — Telephone Encounter (Signed)
Please call patient and that when the last time he had ultrasound of his liver? If he were one was ordered about a year ago but doesn't look like it was never performed. Because of his persistently elevated liver enzymes I do think we should keep an eye on his actual liver. If it has been a while since he had one then please let me know and we can place an order.

## 2013-03-13 NOTE — Telephone Encounter (Signed)
Pt wasn't available so gave message to his wife Junious Dresser. Told her that there was an order for the pt to have an [place] of his liver and that she would like to keep an eye on this and would like for him to still have this done. She stated that Dr. Linford Arnold referred him to a Hep C clinic. I looked back at the referral that was placed and he has an appt on 4.16.2014 @ 1130 with [facility] [phone]) and they were to contact pt with appt information. (I did give this information to his wife). She wanted to know if this is something that should be done prior to his appt on 4.16? I told her that it would be a good idea for this to be done. She stated that the pt was working and that she would relay this message to him and have him to call me back.Loralee Pacas [place]

## 2013-03-14 ENCOUNTER — Other Ambulatory Visit: Payer: Self-pay | Admitting: *Deleted

## 2013-03-14 DIAGNOSIS — K760 Fatty (change of) liver, not elsewhere classified: Secondary | ICD-10-CM

## 2013-03-16 ENCOUNTER — Ambulatory Visit (HOSPITAL_BASED_OUTPATIENT_CLINIC_OR_DEPARTMENT_OTHER)
Admission: RE | Admit: 2013-03-16 | Discharge: 2013-03-16 | Disposition: A | Discharge status: Home / Self Care | Payer: 59 | Source: Ambulatory Visit | Attending: Family Medicine | Admitting: Family Medicine

## 2013-03-16 DIAGNOSIS — R7989 Other specified abnormal findings of blood chemistry: Secondary | ICD-10-CM | POA: Insufficient documentation

## 2013-03-16 DIAGNOSIS — K760 Fatty (change of) liver, not elsewhere classified: Secondary | ICD-10-CM

## 2013-03-16 DIAGNOSIS — K8020 Calculus of gallbladder without cholecystitis without obstruction: Secondary | ICD-10-CM | POA: Insufficient documentation

## 2013-03-21 ENCOUNTER — Ambulatory Visit: Payer: 59 | Admitting: Family Medicine

## 2013-03-21 ENCOUNTER — Telehealth: Payer: Self-pay | Admitting: *Deleted

## 2013-03-21 MED ORDER — HYDROCHLOROTHIAZIDE 12.5 MG PO CAPS: 12.5000 mg | ORAL_CAPSULE | Freq: Every day | ORAL | Status: DC

## 2013-03-21 NOTE — Telephone Encounter (Signed)
Pateint was seen at [facility] this morning and states his BP was 142/92 and wants to know if he needs to adjust his BP meds to help bring the BP back down. Barry Dienes, LPN

## 2013-03-21 NOTE — Telephone Encounter (Signed)
We will start a low dose of chlorothiazide. I will send it over to his pharmacy and he can keep an eye on his blood pressures over the next few weeks. Followup with me in one month.

## 2013-03-21 NOTE — Telephone Encounter (Signed)
Left detailed message of MD instructions. Barry Dienes, LPN

## 2013-04-05 ENCOUNTER — Telehealth: Payer: Self-pay | Admitting: *Deleted

## 2013-04-05 MED ORDER — FLUTICASONE PROPIONATE 50 MCG/ACT NA SUSP: 2.0000 | Freq: Every day | NASAL | Status: DC

## 2013-04-05 NOTE — Telephone Encounter (Signed)
I recommend going back down on the loratadine to once a day and then will add a nasal steroid spray. I will send over a generic Flonase. He does take about 3-4 days for it to start working so don't expect instant relief.

## 2013-04-05 NOTE — Telephone Encounter (Signed)
Pt called and informed via vm that Dr. Linford Arnold recommended that he goes down to taking the loratadine once a day and she has added a nasal spray (generic Flonase) for him to start. Informed that it will take about 3-4 days to notice any changes/improvement so don't expect any instant relief. Told to call back if he had any additional questions.Laureen Ochs, Viann Shove

## 2013-04-05 NOTE — Telephone Encounter (Signed)
Pt states he has been taking antihistamines for nasal drainage- Loratadine up to 3 times a day and its not working. Wants to know if there is something you can give him that would help with nasal drainage.

## 2013-04-20 ENCOUNTER — Institutional Professional Consult (permissible substitution): Payer: 59 | Admitting: Cardiology

## 2013-04-24 ENCOUNTER — Telehealth: Payer: Self-pay | Admitting: *Deleted

## 2013-04-24 NOTE — Telephone Encounter (Signed)
Left detailed message on machine.

## 2013-04-24 NOTE — Telephone Encounter (Signed)
Pt called back and appt made w/hommel for tomorrow @ 915.Loralee Pacas [place]

## 2013-04-24 NOTE — Telephone Encounter (Signed)
Pantoprazole, metoprolol, lisinopril, hydrochlorothiazide all have diarrhea included in their side effect profile.  I'll forward this to his PCP but I anticipate that Dr. Judie Petit would agree that this would be worth an office visit to investigate if it's something more than just a side effect.

## 2013-04-24 NOTE — Telephone Encounter (Signed)
Pt called & left vm asking if any of the medications he takes could be causing him to have IBS-like symptoms.  He stated that he's having "constant bowel movements" and is in a lot of pain.  This has caused him to miss one day of work so far.  Please advise

## 2013-04-25 ENCOUNTER — Ambulatory Visit (INDEPENDENT_AMBULATORY_CARE_PROVIDER_SITE_OTHER): Payer: 59

## 2013-04-25 ENCOUNTER — Telehealth: Payer: Self-pay | Admitting: Family Medicine

## 2013-04-25 ENCOUNTER — Encounter: Payer: Self-pay | Admitting: Family Medicine

## 2013-04-25 ENCOUNTER — Ambulatory Visit (INDEPENDENT_AMBULATORY_CARE_PROVIDER_SITE_OTHER): Payer: 59 | Admitting: Family Medicine

## 2013-04-25 ENCOUNTER — Institutional Professional Consult (permissible substitution): Payer: 59 | Admitting: Cardiology

## 2013-04-25 VITALS — BP 99/57 | HR 57 | Wt 198.0 lb

## 2013-04-25 DIAGNOSIS — Z85118 Personal history of other malignant neoplasm of bronchus and lung: Secondary | ICD-10-CM

## 2013-04-25 DIAGNOSIS — K5900 Constipation, unspecified: Secondary | ICD-10-CM

## 2013-04-25 DIAGNOSIS — R198 Other specified symptoms and signs involving the digestive system and abdomen: Secondary | ICD-10-CM

## 2013-04-25 DIAGNOSIS — K6389 Other specified diseases of intestine: Secondary | ICD-10-CM

## 2013-04-25 NOTE — Telephone Encounter (Signed)
Ronnie Martin, Will you please let [name] know that he has a moderate amount of stool in his colon which is likely contributing to his increased bowel movments the past five days.  I'd like him to start a bowel clean out reigmen involving miralax using a capful dissolved in water two to three times a day for the next four days.  This should increase his bowel movmements which is the goal to get a good portion of his stool burden out.

## 2013-04-25 NOTE — Progress Notes (Signed)
CC: Ronnie Martin is a 57 y.o. male is here for bowel issues   Subjective: HPI:  Patient complains of increased bowel movements in the last 5 days. Reports a history of having 5 bowel movements a day for the last decades. Abruptly 5 days ago has had increased need to defecate with sensation of incomplete voiding. Defecating up to 15-20 times a day. Worse after eating meals, improves with yogurt and Imodium. Describes stool as finger sized and formed requiring straining. Colonoscopy 2012 showing polyps with recommendations to repeat in 2017. Denies blood in stool nor tar-like stool. Denies mucus in stool nor watery stool. Denies recent antibiotic use or known C. difficile exposure.  Has not started any new medications, did try to days of MiraLax without change in bowel habits. Nothing else makes better or worse, mild to moderate severity, present any hour of the day not awakening from sleep. Denies fevers, chills, unintentional weight loss, abdominal pain, chest pain, genitourinary complaints   Review Of Systems Outlined In HPI  Past Medical History  Diagnosis Date  . Hypertension   . Carcinoma of lung   . Hepatitis C   . GERD (gastroesophageal reflux disease)   . Diverticulosis of colon   . Colon polyps     history of polyps     Family History  Problem Relation Age of Onset  . Rectal cancer Father      History  Substance Use Topics  . Smoking status: Former Smoker    Types: Cigarettes    Quit date: 12/06/2009  . Smokeless tobacco: Not on file  . Alcohol Use: No     Objective: Filed Vitals:   04/25/13 0922  BP: 99/57  Pulse: 57    General: Alert and Oriented, No Acute Distress HEENT: Pupils equal, round, reactive to light. Conjunctivae clear.  Moist mucous membranes Lungs: Clear to auscultation bilaterally, no wheezing/ronchi/rales.  Comfortable work of breathing. Good air movement. Cardiac: Regular rate and rhythm. Normal S1/S2.  No murmurs, rubs, nor gallops.    Abdomen: Normal bowel sounds, soft and non tender without palpable masses. Extremities: No peripheral edema.  Strong peripheral pulses.  Mental Status: No depression, anxiety, nor agitation. Skin: Warm and dry.  Assessment & Plan: Ronnie Martin was seen today for bowel issues.  Diagnoses and associated orders for this visit:  Irregular bowel habits - DG Abd 2 Views; Future    Abdominal flat plate obtained 2 screen for fecal impaction showing only moderate stool burden therefore recommendations given to start bowel cleanout using 2-3 capsules of MiraLax a day for the next 3-4 days. If not improved over the weekend we'll consider stool studies, lactoferrin, possibly colonoscopy  Return if symptoms worsen or fail to improve.

## 2013-04-26 NOTE — Telephone Encounter (Signed)
Pt notified of instructions and results. Instructed to call back if does not feel any better. Barry Dienes, LPN

## 2013-05-02 ENCOUNTER — Ambulatory Visit (INDEPENDENT_AMBULATORY_CARE_PROVIDER_SITE_OTHER): Payer: 59 | Admitting: Cardiology

## 2013-05-02 ENCOUNTER — Encounter: Payer: Self-pay | Admitting: Cardiology

## 2013-05-02 ENCOUNTER — Telehealth: Payer: Self-pay | Admitting: *Deleted

## 2013-05-02 VITALS — BP 110/78 | HR 60 | Ht 71.0 in | Wt 205.0 lb

## 2013-05-02 DIAGNOSIS — R0989 Other specified symptoms and signs involving the circulatory and respiratory systems: Secondary | ICD-10-CM

## 2013-05-02 DIAGNOSIS — R0600 Dyspnea, unspecified: Secondary | ICD-10-CM

## 2013-05-02 DIAGNOSIS — R0609 Other forms of dyspnea: Secondary | ICD-10-CM

## 2013-05-02 DIAGNOSIS — I10 Essential (primary) hypertension: Secondary | ICD-10-CM

## 2013-05-02 MED ORDER — LOSARTAN POTASSIUM 100 MG PO TABS: 100.0000 mg | ORAL_TABLET | Freq: Every day | ORAL | Status: DC

## 2013-05-02 NOTE — Assessment & Plan Note (Signed)
Blood pressure controlled.continue present medications. 

## 2013-05-02 NOTE — Patient Instructions (Addendum)
Your physician recommends that you schedule a follow-up appointment in: AS NEEDED  Your physician has requested that you have an exercise tolerance test. For further information please visit [email or web address]. Please also follow instruction sheet, as given.    Exercise Stress Electrocardiography An exercise stress test is a heart test (EKG) which is done while you are moving. You will walk on a treadmill. This test will tell your doctor how your heart does when it is forced to work harder and how much activity you can safely handle. BEFORE THE TEST  Wear shorts or athletic pants.  Wear comfortable tennis shoes.  Women need to wear a bra that allows patches to be put on under it. TEST  An EKG cable will be attached to your waist. This cable is hooked up to patches, which look like round stickers stuck to your chest.  You will be asked to walk on the treadmill.  You will walk until you are too tired or until you are told to stop.  Tell the doctor right away if you have:  Chest pain.  Leg cramps.  Shortness of breath.  Dizziness.  The test may last 30 minutes to 1 hour. The timing depends on your physical condition and the condition of your heart. AFTER THE TEST  You will rest for about 6 minutes. During this time, your heart rhythm and blood pressure will be checked.  The testing equipment will be removed from your body and you can get dressed.  You may go home or back to your hospital room. You may keep doing all your usual activities as told by your doctor. Finding out the results of your test Ask when your test results will be ready. Make sure you get your test results. Document Released: 05/10/2008 Document Revised: 02/14/2012 Document Reviewed: 05/10/2008 ExitCare Patient Information 2014 [employer].   

## 2013-05-02 NOTE — Progress Notes (Signed)
HPI: 57 year old male for evaluation of dyspnea. I have seen him in the past for hypertension. He was last seen in January of 2011. A Myoview was performed in January of 2010. Ejection fraction was 67% and the perfusion was normal. Patient states that for the past several months he has noticed increased dyspnea with more vigorous activities. It does not occur with routine activities. No orthopnea, PND, pedal edema, chest pain or syncope. He has multiple friends with coronary disease and is concerned about this.  Current Outpatient Prescriptions  Medication Sig Dispense Refill  . albuterol (VENTOLIN HFA) 108 (90 BASE) MCG/ACT inhaler INHALE 2 PUFFS INTO THE LUNGS EVERY 6 HOURS AS NEEDED FOR WHEEZING.  18 each  1  . aspirin 81 MG tablet Take 81 mg by mouth daily.        . fluticasone (FLONASE) 50 MCG/ACT nasal spray Place 2 sprays into the nose daily.  16 g  3  . hydrochlorothiazide (MICROZIDE) 12.5 MG capsule Take 1 capsule (12.5 mg total) by mouth daily.  30 capsule  1  . ibuprofen (ADVIL,MOTRIN) 200 MG tablet Take 600 mg by mouth every 6 (six) hours as needed.        Marland Kitchen lisinopril (PRINIVIL,ZESTRIL) 40 MG tablet Take 40 mg by mouth as needed.      . metoprolol succinate (TOPROL-XL) 100 MG 24 hr tablet Take 50 mg by mouth 2 (two) times daily. Take with or immediately following a meal.      . multivitamin (THERAGRAN) per tablet Take 1 tablet by mouth daily.       . pantoprazole (PROTONIX) 40 MG tablet Take 1 tablet (40 mg total) by mouth daily.  90 tablet  1  . Testosterone (ANDROGEL) 20.25 MG/1.25GM (1.62%) GEL Place 3 Act onto the skin daily.  3.75 g  3   No current facility-administered medications for this visit.    Allergies  Allergen Reactions  . Codeine     REACTION: nausea  . Concerta (Methylphenidate) Other (See Comments)    Elevated BP    Past Medical History  Diagnosis Date  . Hypertension   . Carcinoma of lung   . Hepatitis C   . GERD (gastroesophageal reflux disease)   .  Diverticulosis of colon   . Colon polyps     history of polyps    Past Surgical History  Procedure Laterality Date  . Forearm surgery    . Hip surgery  2006    right hip  . Lung lobectomy  11/24/06  . 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     History   Social History  . Marital Status: Married    Spouse Name: N/A    Number of Children: N/A  . Years of Education: N/A   Occupational History  . Not on file.   Social History Main Topics  . Smoking status: Former Smoker    Types: Cigarettes    Quit date: 12/06/2009  . Smokeless tobacco: Not on file  . Alcohol Use: No  . Drug Use: No  . Sexually Active: Not on file   Other Topics Concern  . Not on file   Social History Narrative  . No narrative on file    Family History  Problem Relation Age of Onset  . Rectal cancer Father     ROS: no fevers or chills, productive cough, hemoptysis, dysphasia, odynophagia, melena, hematochezia, dysuria, hematuria, rash, seizure activity, orthopnea, PND, pedal edema, claudication. Remaining systems are  negative.  Physical Exam:   Blood pressure 110/78, pulse 60, height 5\' 11"  (1.803 m), weight 205 lb (92.987 kg).  General:  Well developed/well nourished in NAD Skin warm/dry, multiple tattoos. Patient not depressed No peripheral clubbing Back-normal HEENT-normal/normal eyelids Neck supple/normal carotid upstroke bilaterally; no bruits; no JVD; no thyromegaly chest - CTA/ normal expansion CV - RRR/normal S1 and S2; no murmurs, rubs or gallops;  PMI nondisplaced Abdomen -NT/ND, no HSM, no mass, + bowel sounds, no bruit 2+ femoral pulses, no bruits Ext-no edema, chords, 2+ DP Neuro-grossly nonfocal  ECG sinus rhythm at a rate of 60. No ST changes.

## 2013-05-02 NOTE — Telephone Encounter (Signed)
Pt on Lisinopril and seen cardiologist today and he told him that the med was probably inducing the cough and told to call PCP to see if can change med.

## 2013-05-02 NOTE — Telephone Encounter (Signed)
Ok will change to ARB. Rx sent. Cough should improve over 2-3 weeeks.

## 2013-05-02 NOTE — Assessment & Plan Note (Signed)
Plan exercise treadmill. If negative no plans for further ischemia evaluation.

## 2013-05-03 NOTE — Telephone Encounter (Signed)
Pt notified of MD instructions and med sent to pharmacy. Barry Dienes, LPN

## 2013-05-14 ENCOUNTER — Telehealth: Payer: Self-pay | Admitting: *Deleted

## 2013-05-14 ENCOUNTER — Ambulatory Visit: Payer: 59 | Admitting: Family Medicine

## 2013-05-14 MED ORDER — LOSARTAN POTASSIUM 100 MG PO TABS: 50.0000 mg | ORAL_TABLET | Freq: Two times a day (BID) | ORAL | Status: DC

## 2013-05-14 NOTE — Telephone Encounter (Signed)
Pt calls and states he has been on the Cozaar 100mg  for his BP but is taking 1/2 tablet in the am and 1/2 in the pm. Needs new rx sent to pharmacy stating these directions or the 50mg  sent to take one twice a day

## 2013-05-14 NOTE — Telephone Encounter (Signed)
New rx sent

## 2013-05-15 ENCOUNTER — Other Ambulatory Visit: Payer: Self-pay | Admitting: *Deleted

## 2013-05-15 MED ORDER — METOPROLOL TARTRATE 50 MG PO TABS: 50.0000 mg | ORAL_TABLET | Freq: Two times a day (BID) | ORAL | Status: DC

## 2013-05-15 NOTE — Telephone Encounter (Signed)
Pt calls and states he wants to get the Metoprolol 50mg  sent to his pharmacy instead of having to cut the 100mg  in half. Sent the Metoprolol 50mg  taking 1 twice a day to [facility]. Barry Dienes, LPN

## 2013-05-15 NOTE — Telephone Encounter (Signed)
Pt notified via VM new rx sent

## 2013-05-18 ENCOUNTER — Other Ambulatory Visit: Payer: Self-pay | Admitting: Family Medicine

## 2013-05-22 ENCOUNTER — Other Ambulatory Visit: Payer: Self-pay | Admitting: *Deleted

## 2013-05-22 MED ORDER — ALBUTEROL SULFATE HFA 108 (90 BASE) MCG/ACT IN AERS: INHALATION_SPRAY | RESPIRATORY_TRACT | Status: DC

## 2013-05-24 ENCOUNTER — Ambulatory Visit (INDEPENDENT_AMBULATORY_CARE_PROVIDER_SITE_OTHER): Payer: 59 | Admitting: Physician Assistant

## 2013-05-24 DIAGNOSIS — R9439 Abnormal result of other cardiovascular function study: Secondary | ICD-10-CM

## 2013-05-24 DIAGNOSIS — R0600 Dyspnea, unspecified: Secondary | ICD-10-CM

## 2013-05-24 NOTE — Patient Instructions (Addendum)
PLEASE SCHEDULE FOR Ronnie Martin

## 2013-05-24 NOTE — Progress Notes (Signed)
Exercise Treadmill Test  Pre-Exercise Testing Evaluation Rhythm: sinus bradycardia  Rate: 58     Test  Exercise Tolerance Test Ordering MD: Olga Millers, MD  Interpreting MD: Tereso Newcomer, PA-C  Unique Test No: *1**  Treadmill:  1  Indication for ETT: dyspnea  Contraindication to ETT: No   Stress Modality: exercise - treadmill  Cardiac Imaging Performed: non   Protocol: standard Bruce - maximal  Max BP:  178/50  Max MPHR (bpm):  163 85% MPR (bpm):  139  MPHR obtained (bpm):  120 % MPHR obtained:  74  Reached 85% MPHR (min:sec):  n/a Total Exercise Time (min-sec):  4:39  Workload in METS:  6.5 Borg Scale: 19  Reason ETT Terminated:  dyspnea    ST Segment Analysis At Rest: non-specific ST segment slurring With Exercise: borderline ST changes  Other Information Arrhythmia:  No Angina during ETT:  absent (0) Quality of ETT:  non-diagnostic  ETT Interpretation:  borderline (indeterminate) with non-specific ST changes  Comments: Poor exercise tolerance. 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 No chest pain.  He did c/o significant dyspnea.  Normal BP response to exercise. Borderline inf-lat ST changes at submaximal exercise.  Cannot rule out ischemia.   Recommendations: Patient will be scheduled for [facility]. Signed,  Tereso Newcomer, PA-C   05/24/2013 11:17 AM

## 2013-07-06 ENCOUNTER — Telehealth: Payer: Self-pay | Admitting: Cardiology

## 2013-07-06 NOTE — Telephone Encounter (Signed)
New prob  Pt has regarding his test scheduled for Monday.

## 2013-07-06 NOTE — Telephone Encounter (Signed)
Pt called for stress test instruction. A lexiscan Myoview is scheduled for this coming Monday 8/4. I Went over with pt the [employer] instructions, pt is aware to hold the Metoprolol medication the evening prior and the morning of the test . Pt verbalized understanding.

## 2013-07-09 ENCOUNTER — Ambulatory Visit (HOSPITAL_COMMUNITY): Payer: 59 | Attending: Cardiology | Admitting: Radiology

## 2013-07-09 ENCOUNTER — Encounter (HOSPITAL_COMMUNITY): Payer: 59

## 2013-07-09 VITALS — BP 135/77 | Ht 71.0 in | Wt 208.0 lb

## 2013-07-09 DIAGNOSIS — R9439 Abnormal result of other cardiovascular function study: Secondary | ICD-10-CM

## 2013-07-09 DIAGNOSIS — J45909 Unspecified asthma, uncomplicated: Secondary | ICD-10-CM | POA: Insufficient documentation

## 2013-07-09 DIAGNOSIS — R0600 Dyspnea, unspecified: Secondary | ICD-10-CM

## 2013-07-09 DIAGNOSIS — R0602 Shortness of breath: Secondary | ICD-10-CM

## 2013-07-09 DIAGNOSIS — R0609 Other forms of dyspnea: Secondary | ICD-10-CM | POA: Insufficient documentation

## 2013-07-09 DIAGNOSIS — Z87891 Personal history of nicotine dependence: Secondary | ICD-10-CM | POA: Insufficient documentation

## 2013-07-09 DIAGNOSIS — R002 Palpitations: Secondary | ICD-10-CM | POA: Insufficient documentation

## 2013-07-09 DIAGNOSIS — R079 Chest pain, unspecified: Secondary | ICD-10-CM

## 2013-07-09 DIAGNOSIS — R0989 Other specified symptoms and signs involving the circulatory and respiratory systems: Secondary | ICD-10-CM | POA: Insufficient documentation

## 2013-07-09 MED ORDER — REGADENOSON 0.4 MG/5ML IV SOLN
0.4000 mg | Freq: Once | INTRAVENOUS | Status: AC
  Administered 2013-07-09: 0.4 mg via INTRAVENOUS

## 2013-07-09 MED ORDER — TECHNETIUM TC 99M SESTAMIBI GENERIC - CARDIOLITE
11.0000 | Freq: Once | INTRAVENOUS | Status: AC | PRN
  Administered 2013-07-09: 11 via INTRAVENOUS

## 2013-07-09 MED ORDER — TECHNETIUM TC 99M SESTAMIBI GENERIC - CARDIOLITE
33.0000 | Freq: Once | INTRAVENOUS | Status: AC | PRN
  Administered 2013-07-09: 33 via INTRAVENOUS

## 2013-07-09 NOTE — Progress Notes (Signed)
[facility] SITE 3 NUCLEAR MED [address] [place], [place] [address] [phone]    Cardiology Nuclear Med Study  Ronnie Martin is a 57 y.o. male     MRN : [id number]     DOB: [birth year]  Procedure Date: 07/09/2013  Nuclear Med Background Indication for Stress Test:  Evaluation for Ischemia History:  Asthma and 6/14 GXT unable to achieve target HR/border line ST changes;10 MPS: EF=67% and no ischemia Cardiac Risk Factors: History of Smoking  Symptoms:  DOE and Palpitations   Nuclear Pre-Procedure Caffeine/Decaff Intake:  None> 12 hrs NPO After: 9:00pm   Lungs:  clear O2 Sat: 96% on room air. IV 0.9% NS with Angio Cath:  20g  IV Site: R Antecubital x 1, tolerated well IV Started by:  Irean Hong, RN  Chest Size (in):  44 Cup Size: n/a  Height: 5\' 11"  (1.803 m)  Weight:  208 lb (94.348 kg)  BMI:  Body mass index is 29.02 kg/(m^2). Tech Comments:  Held Lopressor and Losartan 24 hrs    Nuclear Med Study 1 or 2 day study: 1 day  Stress Test Type:  Treadmill/Lexiscan  Reading MD: Marca Ancona, MD  Order Authorizing Provider:  Olga Millers, MD, and Tereso Newcomer, [facility]  Resting Radionuclide: Technetium 1m Sestamibi  Resting Radionuclide Dose: 11.0 mCi   Stress Radionuclide:  Technetium 46m Sestamibi  Stress Radionuclide Dose: 33.0 mCi           Stress Protocol Rest HR: 69 Stress HR: 112  Rest BP: 135/77 Stress BP: 160/81  Exercise Time (min): 2:00 METS: 1.6   Predicted Max HR: 163 bpm % Max HR: 68.71 bpm Rate Pressure Product: 21308   Dose of Adenosine (mg):  n/a Dose of Lexiscan: 0.4 mg  Dose of Atropine (mg): n/a Dose of Dobutamine: n/a mcg/kg/min (at max HR)  Stress Test Technologist: Cathlyn Parsons, RN  Nuclear Technologist:  Domenic Polite, CNMT     Rest Procedure:  Myocardial perfusion imaging was performed at rest 45 minutes following the intravenous administration of Technetium 51m Sestamibi. Rest ECG: NSR - Normal EKG  Stress Procedure:   The patient received IV Lexiscan 0.4 mg over 15-seconds with concurrent low level exercise and then Technetium 50m Sestamibi was injected at 30-seconds while the patient continued walking one more minute. Patient had chest tightness 2-3/10,SOB with infusion. Symptoms relieved in recovery Quantitative spect images were obtained after a 45-minute delay. Stress ECG: No significant change from baseline ECG  QPS Raw Data Images:  Normal; no motion artifact; normal heart/lung ratio. Stress Images:  Small, mild basal to mid inferior perfusion defect. Rest Images:  Small, mild basal to mid inferior perfusion defect. Subtraction (SDS):  Fixed small, mild basal to mid inferior perfusion defect. Transient Ischemic Dilatation (Normal <1.22):  n/a Lung/Heart Ratio (Normal <0.45):  0.57  Quantitative Gated Spect Images QGS EDV:  80 ml QGS ESV:  25 ml  Impression Exercise Capacity:  Lexiscan with no exercise. BP Response:  Normal blood pressure response. Clinical Symptoms:  Chest tightness, abdominal cramping.  ECG Impression:  No significant ST segment change suggestive of ischemia. Comparison with Prior Nuclear Study: No images to compare  Overall Impression:  Low risk stress nuclear study with a fixed, small mild basal to mid inferior perfusion defect.  This probably represents diaphragmatic attenuation given normal wall motion.  No ischemia. .  LV Ejection Fraction: 68%.  LV Wall Motion:  NL LV Function; NL Wall Motion  Marca Ancona 07/09/2013

## 2013-07-10 ENCOUNTER — Encounter: Payer: Self-pay | Admitting: Physician Assistant

## 2013-07-11 ENCOUNTER — Telehealth: Payer: Self-pay | Admitting: Cardiology

## 2013-07-11 NOTE — Telephone Encounter (Signed)
Spoke with pt, he reports he woke the day after his nuclear stress test with severe hemorrhoids he wants to know if that could be related to the medicine he received. Spoke with the nuclear department, pt made aware no one has ever had that problem, not sure due to nuclear testing. Pt voiced understanding.

## 2013-07-11 NOTE — Telephone Encounter (Signed)
New Prob    Pt states he woke up with extreme hemorrhoids and feels it has to do with the nuclear stress test he had. Pt would like to discuss this issue with a nurse.

## 2013-07-11 NOTE — Telephone Encounter (Signed)
Follow up ° ° °Pt returning your call °

## 2013-07-11 NOTE — Telephone Encounter (Signed)
Left message for pt to call.

## 2013-07-13 ENCOUNTER — Telehealth: Payer: Self-pay | Admitting: *Deleted

## 2013-07-13 MED ORDER — HYDROCORTISONE ACETATE 25 MG RE SUPP: 25.0000 mg | Freq: Two times a day (BID) | RECTAL | Status: DC

## 2013-07-13 MED ORDER — LIDOCAINE HCL 2 % EX GEL: CUTANEOUS | Status: DC | PRN

## 2013-07-13 NOTE — Telephone Encounter (Signed)
I sent over prescription to his pharmacy. If he is not improving then please have him make an office visit on Monday. If he gets worse over the weekend he may need to go to urgent care. Sometimes hemorrhoids to get thrombosed and need to be lanced to get relief.

## 2013-07-13 NOTE — Telephone Encounter (Signed)
Pt notified med sent and to make appointment if n better by Monday or UC over weekend. Barry Dienes, LPN

## 2013-07-13 NOTE — Telephone Encounter (Signed)
Had nuc stress test then next day woke up with severe hemorroids.  No straining, no diarrhea.  Had to call into work last 2 days so painful.  Used all OTC meds for them but no help.  Wants to know if you can send a rx in that can help.  Send to [facility]

## 2013-07-16 ENCOUNTER — Encounter: Payer: Self-pay | Admitting: Family Medicine

## 2013-07-16 ENCOUNTER — Ambulatory Visit (INDEPENDENT_AMBULATORY_CARE_PROVIDER_SITE_OTHER): Payer: 59 | Admitting: Family Medicine

## 2013-07-16 VITALS — BP 153/89 | HR 87 | Wt 212.0 lb

## 2013-07-16 DIAGNOSIS — K645 Perianal venous thrombosis: Secondary | ICD-10-CM

## 2013-07-16 NOTE — Progress Notes (Signed)
CC: Ronnie Martin is a 57 y.o. male is here for Hemorrhoids   Subjective: HPI:  Patient complains of rectal pain that has been present for 5 days it is described as a pressure sensation moderate to severe in severity it is worse with sitting down or when having bowel movements. Slowly worsening on a daily basis. Describes it as a broom stick stuck in his rectum. Has had little to no improvement with suppositories over the past 4 days. Denies bleeding or tar-like stool. He reports having bowel movements up to 4 times a day which is not uncommon for him. Denies abdominal pain, fevers, chills, dysuria nor genitourinary complaints   Review Of Systems Outlined In HPI  Past Medical History  Diagnosis Date  . Hypertension   . Carcinoma of lung   . Hepatitis C   . GERD (gastroesophageal reflux disease)   . Diverticulosis of colon   . Colon polyps     history of polyps  . Hx of cardiovascular stress test     ETT/Lexiscan Myoview 8/14:  Low risk; small mild basal to mid inferior defect-probably represents diaphragmatic attenuation with normal wall motion, no ischemia, EF 68%     Family History  Problem Relation Age of Onset  . Rectal cancer Father      History  Substance Use Topics  . Smoking status: Former Smoker    Types: Cigarettes    Quit date: 12/06/2009  . Smokeless tobacco: Not on file  . Alcohol Use: No     Objective: Filed Vitals:   07/16/13 1306  BP: 153/89  Pulse: 87    General: Alert and Oriented, No Acute Distress Abdomen: Normal bowel sounds, soft and non tender without palpable masses. Rectal: Normal rectal tone using anoscopy I do not see any internal hemorrhoids he does have a external hemorrhoid that I am unable to decompress with palpation it is tender to touch it reproduces his pain Extremities: No peripheral edema.  Strong peripheral pulses.  Mental Status: No depression, anxiety, nor agitation. Skin: Warm and dry.  Assessment & Plan: Albertus was seen  today for hemorrhoids.  Diagnoses and associated orders for this visit:  Thrombosed external hemorrhoid - Ambulatory referral to General Surgery    Discussed with patient my suspicion for thrombosed external hemorrhoid with definitive care requiring surgical excision referral to surgery has been placed continue with suppositories.  Return if symptoms worsen or fail to improve.

## 2013-07-17 ENCOUNTER — Encounter (INDEPENDENT_AMBULATORY_CARE_PROVIDER_SITE_OTHER): Payer: Self-pay | Admitting: General Surgery

## 2013-07-17 ENCOUNTER — Encounter (INDEPENDENT_AMBULATORY_CARE_PROVIDER_SITE_OTHER): Payer: Self-pay

## 2013-07-17 ENCOUNTER — Ambulatory Visit (INDEPENDENT_AMBULATORY_CARE_PROVIDER_SITE_OTHER): Payer: Commercial Managed Care - PPO | Admitting: General Surgery

## 2013-07-17 VITALS — BP 144/98 | HR 64 | Temp 97.3°F | Resp 14 | Ht 71.0 in | Wt 211.4 lb

## 2013-07-17 DIAGNOSIS — K645 Perianal venous thrombosis: Secondary | ICD-10-CM

## 2013-07-17 NOTE — Progress Notes (Signed)
Patient ID: Ronnie Martin, male   DOB: 03-16-1956, 57 y.o.   MRN: [id number] History: This patient was referred by Dr. Laren Boom, DO  for evaluation and management of a recurrent thrombosed external hemorrhoid, right side. The patient states that he has had 8 days of pain and swelling in the perianal area. He had a similar episode 6 weeks ago that resolved spontaneously. No prior rectal surgery. He is having daily bowel movements.  past history significant for VATS  procedure by Dr.  Edwyna Shell for some type of neoplasm did not require further therapy. Hypertension. Hepatitis C. GERD. Has colon polyps had last colonoscopy 2 years ago  ROS: 10 system review of systems is negative except as described above  Exam: Patient alert. Cooperative. Mild distress. Some anxiety Lungs clear auscultation Heart regular rate and rhythm Abdomen soft and nontender Rectal thrombosed external hemorrhoid right lateral. No dermatitis. No infection. No abscess. Drainage. This area was anesthetized with 1% Xylocaine with epinephrine following alcohol prep and the hemorrhoid was completely excised. Hemostasis excellent. Tolerated well  Assessment: Thrombosed external hemorrhoid, right lateral, excised today uneventfully Hepatitis C Pulmonary neoplasia, details unknown Hypertension GERD  Plan: Wound care  discussed. Sitz baths or tub bath3 times a day. Stool softeners. Baby wipes. Kotex pads Expect complete healing in 2 weeks Return to see me if further problems arise.   Angelia Mould. Derrell Lolling, M.D., [facility] General and Minimally invasive Surgery Breast and Colorectal Surgery Office:   [phone] Pager:   [phone]

## 2013-07-17 NOTE — Patient Instructions (Signed)
We excised a thrombosed external hemorrhoid on the right side today. This should heal in about 2 weeks.  You will have a little bit of bloody drainage. Wear a pad and change the pad frequently.  Take a sitz bath or warm tub bath 3 times a day.  Take a stool softener once or twice a day if necessary to avoid constipation.  Return to see Dr. Derrell Lolling if further problems arise.

## 2013-07-20 ENCOUNTER — Telehealth: Payer: Self-pay | Admitting: *Deleted

## 2013-07-20 NOTE — Telephone Encounter (Signed)
Pt called and stated that he received a call from someone at our office in regards to [facility] paperwork. Spoke with Sue Lush and she had placed up front and lvm informing pt of this. I told pt and he will come by and p/u Monday.Loralee Pacas [place]

## 2013-07-23 ENCOUNTER — Other Ambulatory Visit: Payer: Self-pay | Admitting: Family Medicine

## 2013-08-27 ENCOUNTER — Ambulatory Visit (INDEPENDENT_AMBULATORY_CARE_PROVIDER_SITE_OTHER): Payer: 59 | Admitting: Family Medicine

## 2013-08-27 ENCOUNTER — Encounter: Payer: Self-pay | Admitting: Family Medicine

## 2013-08-27 VITALS — BP 118/83 | HR 52 | Wt 214.0 lb

## 2013-08-27 DIAGNOSIS — F411 Generalized anxiety disorder: Secondary | ICD-10-CM

## 2013-08-27 DIAGNOSIS — F909 Attention-deficit hyperactivity disorder, unspecified type: Secondary | ICD-10-CM

## 2013-08-27 MED ORDER — SERTRALINE HCL 50 MG PO TABS: ORAL_TABLET | ORAL | Status: DC

## 2013-08-27 NOTE — Progress Notes (Signed)
  Subjective:    Patient ID: Ronnie Martin, male    DOB: 1956/05/14, 57 y.o.   MRN: [id number]  HPI pt stated that when he was taking the adhd meds he did well but since being off of them he is having a lot of anxiety not being able to complete tasks.  The stimulatns would raise his BP and has tried multiple one.  Says can't turn his brain off at time. Can wake up at night and then can' go back to sleep.  Feels he worries excessively.  Says interupps his sleep and then can't go  Back to sleep. Then is exhausted the rest of the day.     Review of Systems     Objective:   Physical Exam  Constitutional: He is oriented to person, place, and time. He appears well-developed and well-nourished.  HENT:  Head: Normocephalic and atraumatic.  Cardiovascular: Normal rate, regular rhythm and normal heart sounds.   Pulmonary/Chest: Effort normal and breath sounds normal.  Neurological: He is alert and oriented to person, place, and time.  Skin: Skin is warm and dry.  Psychiatric: He has a normal mood and affect. His behavior is normal.          Assessment & Plan:  GAD- discussed a controller medication such as an SSRI. Will start with sertraline. Warned about potential S.E of the medication. F/u in 3 weeks to see how he is doing. Will do GAD 7 then.   ADHD - some of sxs are overlapping but intolerant to stimulants.   Time spent 25 min,, > 50% of time spent counseling about generalized anxiety and treatment options.

## 2013-09-03 ENCOUNTER — Telehealth: Payer: Self-pay | Admitting: *Deleted

## 2013-09-03 MED ORDER — FLUOXETINE HCL 20 MG PO TABS: ORAL_TABLET | ORAL | Status: DC

## 2013-09-03 NOTE — Telephone Encounter (Signed)
Pt states the Zoloft is making him sick and is asking if you can send something different.  Meyer Cory, LPN

## 2013-09-03 NOTE — Telephone Encounter (Signed)
Pt states that he was still on the 1/2 tab of Zoloft. Informed pt starting tomorrow to stop Zoloft and start Prozac.  Meyer Cory, LPN

## 2013-09-03 NOTE — Telephone Encounter (Signed)
Cut the sertraline in half for 3 days, and then start new med and stop the sertraline.

## 2013-09-26 ENCOUNTER — Ambulatory Visit (INDEPENDENT_AMBULATORY_CARE_PROVIDER_SITE_OTHER): Payer: 59 | Admitting: Family Medicine

## 2013-09-26 ENCOUNTER — Encounter: Payer: Self-pay | Admitting: Family Medicine

## 2013-09-26 VITALS — BP 136/82 | HR 60 | Wt 211.0 lb

## 2013-09-26 DIAGNOSIS — F909 Attention-deficit hyperactivity disorder, unspecified type: Secondary | ICD-10-CM

## 2013-09-26 DIAGNOSIS — F411 Generalized anxiety disorder: Secondary | ICD-10-CM

## 2013-09-26 MED ORDER — FLUOXETINE HCL 20 MG PO TABS: 20.0000 mg | ORAL_TABLET | Freq: Every day | ORAL | Status: DC

## 2013-09-26 MED ORDER — AMPHETAMINE-DEXTROAMPHET ER 25 MG PO CP24: 25.0000 mg | ORAL_CAPSULE | ORAL | Status: DC

## 2013-09-26 NOTE — Progress Notes (Signed)
  Subjective:    Patient ID: Ronnie Martin, male    DOB: [birth year], 57 y.o.   MRN: [id number]  HPI 1 month ago started on sertraline and got nauseated so called and we stopped it.  He is now on prozac but says didn't get nauseated but felt really sleepy.  Wants to be motivated. Did get HA initially with both.  He stopped hte prozac about 2 days ago.  His wife is on BuSpar and he wonders if he might do better with something like that.  ADHD - says his blood pressures have been coming back down he would actually like to restart Adderall. We have stopped initially because it seemed to be raising his blood pressure. Now his numbers seem to be down consistently he home he is going to retry the medication. Says makes a big difference in his ability to concentrate especially at work. Review of Systems     Objective:   Physical Exam  Constitutional: He is oriented to person, place, and time. He appears well-developed and well-nourished.  HENT:  Head: Normocephalic and atraumatic.  Cardiovascular: Normal rate, regular rhythm and normal heart sounds.   Pulmonary/Chest: Effort normal and breath sounds normal.  Neurological: He is alert and oriented to person, place, and time.  Skin: Skin is warm and dry.  Psychiatric: He has a normal mood and affect. His behavior is normal.          Assessment & Plan:  GAD - he will restart the Prozac. He did miss his dose the last couple days but says he will restart it after we discussed trying to get at least a full 6-8 weeks to reach full efficacy. PHQ- 9 score of 12. He says he is willing to at least try it for a month or 2 before deciding to switch medications.  ADHD-we'll start him on Adderall XR 25 mg. we will need to keep a really close eye on his blood pressures when he restarts the medication. Followup in one month.

## 2013-10-01 ENCOUNTER — Other Ambulatory Visit: Payer: Self-pay | Admitting: Family Medicine

## 2013-10-01 NOTE — Telephone Encounter (Signed)
Needs f/u appt 

## 2013-10-11 ENCOUNTER — Other Ambulatory Visit: Payer: Self-pay

## 2013-10-22 ENCOUNTER — Encounter: Payer: Self-pay | Admitting: Physician Assistant

## 2013-10-22 ENCOUNTER — Ambulatory Visit (INDEPENDENT_AMBULATORY_CARE_PROVIDER_SITE_OTHER): Payer: 59 | Admitting: Physician Assistant

## 2013-10-22 VITALS — BP 130/76 | HR 56 | Wt 211.0 lb

## 2013-10-22 DIAGNOSIS — I498 Other specified cardiac arrhythmias: Secondary | ICD-10-CM

## 2013-10-22 DIAGNOSIS — R002 Palpitations: Secondary | ICD-10-CM

## 2013-10-22 DIAGNOSIS — F419 Anxiety disorder, unspecified: Secondary | ICD-10-CM

## 2013-10-22 DIAGNOSIS — I10 Essential (primary) hypertension: Secondary | ICD-10-CM

## 2013-10-22 DIAGNOSIS — F411 Generalized anxiety disorder: Secondary | ICD-10-CM

## 2013-10-22 DIAGNOSIS — R001 Bradycardia, unspecified: Secondary | ICD-10-CM

## 2013-10-22 MED ORDER — AMBULATORY NON FORMULARY MEDICATION: Status: DC

## 2013-10-22 MED ORDER — CLONAZEPAM 0.5 MG PO TABS: 0.5000 mg | ORAL_TABLET | Freq: Two times a day (BID) | ORAL | Status: DC | PRN

## 2013-10-22 MED ORDER — FLUOXETINE HCL 40 MG PO CAPS: 40.0000 mg | ORAL_CAPSULE | Freq: Every day | ORAL | Status: DC

## 2013-10-22 NOTE — Patient Instructions (Addendum)
Increase prozac 40mg  daily for anxiety.   Decrease metoprolol to 1/2 tablet twice a day.

## 2013-10-22 NOTE — Progress Notes (Signed)
  Subjective:    Patient ID: Ronnie Martin, male    DOB: Jul 25, 1956, 57 y.o.   MRN: [id number]  HPI Patient is a 57 yo male who presents to the clinic with BP issues. He has been checking his BP and it has been dropping to 102/72 randomly. He has recently stopped smoking and wonders if this could be affecting his BP. For the last 2 nights he has heard his heart beat in his head when he lays down. He cannot check his BP at night but when he got to work was low. He does admit to some minimal ear congestion and feeling a little dizzy. He denies any sinus pressure or sore throat. He does still drained MI she is barely functioning. He denies any fever, chills, nausea, vomiting. He has not had any chest pain or palpitation feeling.   He was recently started on prozac and does feel like it is helping his anxiety but feels like he is still very anxious. He almost feels like at night when he hears his heart beat it could be anxiety related and if he just had something to take to help him calm down. Denies suicidal or harmful thoughts to others.   Review of Systems     Objective:   Physical Exam  Constitutional: He is oriented to person, place, and time. He appears well-developed and well-nourished.  HENT:  Head: Normocephalic and atraumatic.  Right Ear: External ear normal.  Left Ear: External ear normal.  Nose: Nose normal.  Mouth/Throat: Oropharynx is clear and moist.  Neck: Normal range of motion. Neck supple.  Cardiovascular: Regular rhythm and normal heart sounds.   Bradycardia 56.  Pulmonary/Chest: Effort normal and breath sounds normal.  Lymphadenopathy:    He has no cervical adenopathy.  Neurological: He is alert and oriented to person, place, and time.  Skin: Skin is warm and dry.  Psychiatric: He has a normal mood and affect. His behavior is normal.          Assessment & Plan:  Sinus bradycardia/pounding heart beat/HTN/Fatigued-unclear etiology today. EKG did show sinus  bradycardia however there was no ST elevation or depression or arrhythmias. There could be some inner ear issues going on making him more susceptible to his heart the. I encouraged him to use his nasal spray Flonase daily for the next 5-7 days. Since pt is so drained and has bradycardia i did cut metoprolol in half to see if this would help his symptoms. Pt is to follow up in 2 weeks with PCP.  Anxiety- I. am unsure of the role anxiety is playing in the symptoms that you think patient would benefit from an increase in Prozac. Increased Prozac to 40 mg today. I also did give him a small dose benzodiazepine to use for acute situations such as when he wakes up at night not able to go to sleep. I discussed in detail that this medicine is not to be used regularly but only as needed. Request that he follows up with his PCP in 2 weeks to discuss ongoing symptoms.  Spent 30 minutes with patient discussing treatment plan for fatigue and pounding heartbeat.

## 2013-10-24 ENCOUNTER — Ambulatory Visit: Payer: 59 | Admitting: Family Medicine

## 2013-10-25 ENCOUNTER — Telehealth: Payer: Self-pay | Admitting: *Deleted

## 2013-10-25 ENCOUNTER — Other Ambulatory Visit: Payer: Self-pay | Admitting: Physician Assistant

## 2013-10-25 MED ORDER — AMLODIPINE BESYLATE 5 MG PO TABS: 5.0000 mg | ORAL_TABLET | Freq: Every day | ORAL | Status: DC

## 2013-10-25 NOTE — Telephone Encounter (Signed)
Stay on lower dose of metoprolol because your pulse is remaining low and that is why I felt like it needed to be decreased. I would like for you to add a small dose of norvasc 10mg  will want and see if that helps BP but does not lower pulse. Follow up in next 2 weeks.

## 2013-10-25 NOTE — Telephone Encounter (Signed)
Pt.notified

## 2013-10-25 NOTE — Telephone Encounter (Signed)
Is pt to also remain taking the losartan as well?  Please clarify.

## 2013-10-25 NOTE — Telephone Encounter (Signed)
Pt called & states that he was woken up by palpitations this morning.  He then checked his bp & it was 189/108.  30 min later it was 160/93, after medication.  15 min after that is was 136/91.  Another 15 min later was 148/88.  He states that you lowered his bp med at the office visit the other day.  Next four readings were 170/96, 151/92 p 53, 165/96 p 54.  It was 167/92 p 55 at 1:20 this afternoon taken at work by a Engineer, civil (consulting).

## 2013-10-25 NOTE — Telephone Encounter (Signed)
NOrvasc 5mg  daily is what was called in.

## 2013-10-25 NOTE — Telephone Encounter (Signed)
Continue losartan as well.

## 2013-10-30 ENCOUNTER — Telehealth: Payer: Self-pay | Admitting: *Deleted

## 2013-10-30 DIAGNOSIS — R002 Palpitations: Secondary | ICD-10-CM

## 2013-10-30 NOTE — Telephone Encounter (Signed)
11/20 198/108  1/2hr  Later 160/93 pt states when he wakes 11/21 133/97 p 53 10/27/13 130/88 p56  10/29/2013 167/100 p 50  10/30/13 141/98 p55 .  Pt stated that his bp's run higher in the AM upon waking and go down somewhat throughout the day. I asked about his caffeine intake he stated that he drinks 2 cups in the morning and that's all and drinks instant tea during the day. I recommended that he cut the coffee to 1 cup and drink more water, decrease sodium intake to 1500-2000 mg per day, increase Norvasc to 10 mg and STOP adderall (pt stated he only took this for 1 day due to the increase of bp) told him that if not better by Monday to call and schedule an appt. He voiced understanding and agreed .Loralee Pacas [place]

## 2013-11-05 ENCOUNTER — Other Ambulatory Visit: Payer: Self-pay | Admitting: Family Medicine

## 2013-11-06 ENCOUNTER — Encounter: Payer: Self-pay | Admitting: Family Medicine

## 2013-11-06 ENCOUNTER — Other Ambulatory Visit: Payer: Self-pay | Admitting: Family Medicine

## 2013-11-06 ENCOUNTER — Ambulatory Visit (INDEPENDENT_AMBULATORY_CARE_PROVIDER_SITE_OTHER): Payer: 59 | Admitting: Family Medicine

## 2013-11-06 ENCOUNTER — Ambulatory Visit (INDEPENDENT_AMBULATORY_CARE_PROVIDER_SITE_OTHER): Payer: 59

## 2013-11-06 VITALS — BP 100/61 | HR 54 | Wt 209.0 lb

## 2013-11-06 DIAGNOSIS — F411 Generalized anxiety disorder: Secondary | ICD-10-CM

## 2013-11-06 DIAGNOSIS — R002 Palpitations: Secondary | ICD-10-CM

## 2013-11-06 DIAGNOSIS — R0602 Shortness of breath: Secondary | ICD-10-CM

## 2013-11-06 DIAGNOSIS — J309 Allergic rhinitis, unspecified: Secondary | ICD-10-CM

## 2013-11-06 DIAGNOSIS — R3911 Hesitancy of micturition: Secondary | ICD-10-CM

## 2013-11-06 LAB — CBC WITH DIFFERENTIAL/PLATELET
Basophils Absolute: 0.1 10*3/uL (ref 0.0–0.1)
Basophils Relative: 1 % (ref 0–1)
Eosinophils Relative: 5 % (ref 0–5)
HCT: 43.3 % (ref 39.0–52.0)
MCHC: 35.6 g/dL (ref 30.0–36.0)
MCV: 83 fL (ref 78.0–100.0)
Monocytes Absolute: 0.6 10*3/uL (ref 0.1–1.0)
Neutro Abs: 2.9 10*3/uL (ref 1.7–7.7)
RDW: 13.8 % (ref 11.5–15.5)
WBC: 5.5 10*3/uL (ref 4.0–10.5)

## 2013-11-06 NOTE — Progress Notes (Signed)
Called [facility] and spoke w/Tara she stated that pt picked up this rx on 10/22/2013 this was for the 40 mg. He last p/u the 20 mg on 09/26/2013.Loralee Pacas [place]

## 2013-11-06 NOTE — Progress Notes (Signed)
Subjective:    Patient ID: Ronnie Martin, male    DOB: Apr 20, 1956, 57 y.o.   MRN: [id number]  HPI Here to followup on palpitations and anxiety. He brought in all bottle sertraline that he says he started taking about 4 days ago when he ran out of fluoxetine. He felt that the same thing. Interestingly after taking it for 4 days he has not had any GI side effects. This medication was originally started in September but discontinued because he called and said he had significant nausea on it. We discontinued his back in September because he had called and said it was making him nauseated. At that time we switched him to the fluoxetine. Thus a little concerned he's actually been taking both medications. He did have a fall of fluoxetine 20 mg has not picked up a new prescription for 40 mg it was sent in last month by [facility]. He did bring in his home blood pressure readings today. He has been taking half tab metoprolol BID and has increase the amlodipine to 10mg  a day.  Still feel fatigued and fees like will hear heart beat in his ears.  Does feel like having a hard time getting a deep breath.    Also concerned because he hasn't taken it allergy pill year-round. He says he never had it before until this year. He wants to know why. He denies any significant sneezing or congestion for says his nose is constantly running.  He also had a hard time emptying his bladder. He says he will feel like he then came in and had to go back and urinate again about 3 minutes later. No dysuria or hematuria. He does stop and start. The tonsils like he has to push to empty his bladder. No hx of prostate problems.    Review of Systems  BP 100/61  Pulse 54  Wt 209 lb (94.802 kg)  SpO2 100%  PF 700 L/min    Allergies  Allergen Reactions  . Codeine     REACTION: nausea  . Concerta [Methylphenidate] Other (See Comments)    Elevated BP  . Lisinopril Cough  . Sertraline Nausea Only    Past Medical History  Diagnosis  Date  . Hypertension   . Carcinoma of lung   . Hepatitis C   . GERD (gastroesophageal reflux disease)   . Diverticulosis of colon   . Colon polyps     history of polyps  . Hx of cardiovascular stress test     ETT/Lexiscan Myoview 8/14:  Low risk; small mild basal to mid inferior defect-probably represents diaphragmatic attenuation with normal wall motion, no ischemia, EF 68%  . Asthma     Past Surgical History  Procedure Laterality Date  . Forearm surgery    . Hip surgery  2006    right hip  . Lung lobectomy  11/24/06  . 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     History   Social History  . Marital Status: Married    Spouse Name: N/A    Number of Children: N/A  . Years of Education: N/A   Occupational History  . Not on file.   Social History Main Topics  . Smoking status: Former Smoker    Types: Cigarettes    Quit date: 12/06/2009  . Smokeless tobacco: Never Used  . Alcohol Use: No  . Drug Use: No  . Sexual Activity: Not on file   Other Topics Concern  .  Not on file   Social History Narrative  . No narrative on file    Family History  Problem Relation Age of Onset  . Rectal cancer Father   . Cancer Father     brain tumos  . Cancer Mother     breast    Outpatient Encounter Prescriptions as of 11/06/2013  Medication Sig  . AMBULATORY NON FORMULARY MEDICATION BP machine.  Dx: HTN  . amLODipine (NORVASC) 10 MG tablet Take 10 mg by mouth daily.  Marland Kitchen amLODipine (NORVASC) 5 MG tablet Take 5 mg by mouth daily.  . ANDROGEL PUMP 20.25 MG/ACT (1.62%) GEL APPLY 3 PUMPS TO SKIN DAILY  . aspirin 81 MG tablet Take 81 mg by mouth daily.    . clonazePAM (KLONOPIN) 0.5 MG tablet Take 1 tablet (0.5 mg total) by mouth 2 (two) times daily as needed for anxiety.  Marland Kitchen FLUoxetine (PROZAC) 40 MG capsule Take 1 capsule (40 mg total) by mouth daily.  . fluticasone (FLONASE) 50 MCG/ACT nasal spray Place 2 sprays into the nose daily.  Marland Kitchen ibuprofen (ADVIL,MOTRIN)  200 MG tablet Take 600 mg by mouth every 6 (six) hours as needed.    Marland Kitchen losartan (COZAAR) 100 MG tablet Take 0.5 tablets (50 mg total) by mouth 2 (two) times daily.  . metoprolol (LOPRESSOR) 50 MG tablet Take 1 tablet (50 mg total) by mouth 2 (two) times daily.  . pantoprazole (PROTONIX) 40 MG tablet TAKE 1 TABLET BY MOUTH ONCE DAILY  . Testosterone (ANDROGEL) 20.25 MG/1.25GM (1.62%) GEL Place 3 Act onto the skin daily.  . [DISCONTINUED] amLODipine (NORVASC) 5 MG tablet Take 1 tablet (5 mg total) by mouth daily.  . [DISCONTINUED] amphetamine-dextroamphetamine (ADDERALL XR) 25 MG 24 hr capsule Take 1 capsule (25 mg total) by mouth every morning.          Objective:   Physical Exam  Constitutional: He is oriented to person, place, and time. He appears well-developed and well-nourished.  HENT:  Head: Normocephalic and atraumatic.  Right Ear: External ear normal.  Left Ear: External ear normal.  Nose: Nose normal.  Mouth/Throat: Oropharynx is clear and moist.  TMs and canals are clear.   Eyes: Conjunctivae and EOM are normal. Pupils are equal, round, and reactive to light.  Neck: Neck supple. No thyromegaly present.  Cardiovascular: Normal rate and normal heart sounds.   Pulmonary/Chest: Effort normal and breath sounds normal.  Lymphadenopathy:    He has no cervical adenopathy.  Neurological: He is alert and oriented to person, place, and time.  Skin: Skin is warm and dry.  Psychiatric: He has a normal mood and affect.          Assessment & Plan:  Palpitations-they seem to be primarily at night. It has been better with the recent medication change. Improved after decreasing the metoprolol to half a tab and increase the amlodipine to 10 mg. He did bring his blood pressure cuff today and in several of the blood pressures are a little but on the lower side. We will go back to amlodipine 5 mg I will see him back in 2 weeks. He will continue to half a tab of the metoprolol and continue  the losartan as well. If he still fairly symptomatic at that point and feeling like he cannot work out then I would like to set up a heart monitor her for him to see if we can capture these episodes. EKG shows normal sinus rhythm with a rate of 54 beats per minute,  no acute ST-T wave changes with a normal axis.   And there is urinary symptoms-I suspect that he has benign prostatic hypertrophy. His AUA score was 28 today which is significant for possible prostate issues. I did not have time to examine the prostate today. When he follows back up in 2 weeks and we will do the exam to confirm enlargement. He did have a normal PSA back in February.  Rhinitis-he does not have any congestion or itching just runny nose. He does seem to get improvement with antihistamine. This may just be a drying effect. He would like to be tested for allergies we'll do some additional blood work today to evaluate for this further.  SOB - Will get CXR today.  Normal pulse ox and peak flows look fantastic.

## 2013-11-07 ENCOUNTER — Telehealth: Payer: Self-pay

## 2013-11-07 ENCOUNTER — Other Ambulatory Visit: Payer: Self-pay | Admitting: *Deleted

## 2013-11-07 DIAGNOSIS — J309 Allergic rhinitis, unspecified: Secondary | ICD-10-CM

## 2013-11-07 LAB — ALLERGY PROFILE REGION II-DC, DE, MD, ~~LOC~~, VA
Allergen, D pternoyssinus,d7: 0.28 kU/L — ABNORMAL HIGH
Alternaria Alternata: 2.72 kU/L — ABNORMAL HIGH
Bermuda Grass: 0.13 kU/L — ABNORMAL HIGH
Box Elder IgE: 0.26 kU/L — ABNORMAL HIGH
Cat Dander: <0.1 kU/L
Dog Dander: <0.1 kU/L
Elm IgE: <0.1 kU/L
IgE (Immunoglobulin E), Serum: 220.4 IU/mL — ABNORMAL HIGH (ref 0.0–180.0)
Johnson Grass: <0.1 kU/L
Lamb's Quarters: 0.15 kU/L — ABNORMAL HIGH
Pecan/Hickory Tree IgE: <0.1 kU/L

## 2013-11-07 MED ORDER — FLUOXETINE HCL 40 MG PO CAPS: 40.0000 mg | ORAL_CAPSULE | Freq: Every day | ORAL | Status: DC

## 2013-11-07 MED ORDER — AMLODIPINE BESYLATE 10 MG PO TABS: 10.0000 mg | ORAL_TABLET | Freq: Every day | ORAL | Status: DC

## 2013-11-07 NOTE — Telephone Encounter (Signed)
Sent refill

## 2013-11-12 ENCOUNTER — Telehealth: Payer: Self-pay

## 2013-11-12 NOTE — Telephone Encounter (Signed)
Left detailed message.   

## 2013-11-12 NOTE — Telephone Encounter (Signed)
Ronnie Martin is having headaches due to the fluoxetine. He would like to stop taking this medication. He also states the medication is not helping. Please advise.

## 2013-11-12 NOTE — Telephone Encounter (Signed)
The headaches are probably because he just restarted the medication about 5 days ago after temporarily using the sertraline when he ran out of fluoxetine. The headache should subside after 2 weeks. I would strongly encouraged him to continue it but if he chooses not to that is up to him.

## 2013-11-20 ENCOUNTER — Ambulatory Visit (INDEPENDENT_AMBULATORY_CARE_PROVIDER_SITE_OTHER): Payer: 59 | Admitting: Family Medicine

## 2013-11-20 ENCOUNTER — Encounter: Payer: Self-pay | Admitting: Family Medicine

## 2013-11-20 VITALS — BP 94/64 | HR 58 | Temp 97.1°F | Wt 206.0 lb

## 2013-11-20 DIAGNOSIS — N400 Enlarged prostate without lower urinary tract symptoms: Secondary | ICD-10-CM

## 2013-11-20 DIAGNOSIS — I10 Essential (primary) hypertension: Secondary | ICD-10-CM

## 2013-11-20 MED ORDER — TAMSULOSIN HCL 0.4 MG PO CAPS: 0.4000 mg | ORAL_CAPSULE | Freq: Every day | ORAL | Status: DC

## 2013-11-20 NOTE — Patient Instructions (Signed)
Stop the metoprolol.  Keep an eye on Blood pressure.

## 2013-11-20 NOTE — Progress Notes (Signed)
   Subjective:    Patient ID: Ronnie Martin, male    DOB: 1956-11-21, 57 y.o.   MRN: [id number]  HPI urinary symptoms-I suspect that he has benign prostatic hypertrophy. His AUA score was 28 today which is significant for possible prostate issues. I did not have time to examine the prostate today. We touched on this briefly at his last appointment I encouraged him to make a followup appointment so can actually did examine his prostate discuss treatment options. He thinks he may taken Flomax years ago before moving here but has not 100% sure. Lab Results  Component Value Date   PSA 0.53 01/24/2013   PSA 0.47 01/18/2012   PSA 0.53 01/20/2011     HTN- he reports his blood pressures have been running a little bit low. He denies any dizziness or lightheadedness today but does have times refills tired and says will yawn repetitively. He are to cut his metoprolol in half and takes his amlodipine regularly.  Review of Systems     Objective:   Physical Exam  Constitutional: He is oriented to person, place, and time. He appears well-developed and well-nourished.  HENT:  Head: Normocephalic and atraumatic.  Genitourinary:  Prostate is enlarged, 2+. No nodules or bogginess or tenderness.  Neurological: He is alert and oriented to person, place, and time.  Skin: Skin is warm and dry.  Psychiatric: He has a normal mood and affect. His behavior is normal.          Assessment & Plan:  BPH - gland is enlarged on exam today with no palpable nodules et Karie Soda. PSA was normal in February. AUA score was 28 which is significant. Recommend treatment with medication. We discussed different options including alpha blockers, phosphodiesterase inhibitors inhibitors Start flomax daily. Followup in 6 weeks to see how well as of the medication and to make adjustments if needed. Repeat PSA in February.  HTN - will stop the metoprolol fo rnow and keep track of BP.  Continue the amlodipine and losartan. If blood  pressures continue to run low then we may need to make adjustments. At one point in time he was 100 mg metoprolol because of elevated blood pressures but they seem to have improved recently.

## 2013-11-22 ENCOUNTER — Telehealth: Payer: Self-pay | Admitting: *Deleted

## 2013-12-10 ENCOUNTER — Encounter: Payer: Self-pay | Admitting: Family Medicine

## 2013-12-10 ENCOUNTER — Ambulatory Visit (INDEPENDENT_AMBULATORY_CARE_PROVIDER_SITE_OTHER): Payer: 59 | Admitting: Family Medicine

## 2013-12-10 VITALS — BP 124/83 | HR 56 | Temp 97.0°F

## 2013-12-10 DIAGNOSIS — N400 Enlarged prostate without lower urinary tract symptoms: Secondary | ICD-10-CM

## 2013-12-10 DIAGNOSIS — I10 Essential (primary) hypertension: Secondary | ICD-10-CM

## 2013-12-10 DIAGNOSIS — R5381 Other malaise: Secondary | ICD-10-CM

## 2013-12-10 DIAGNOSIS — B079 Viral wart, unspecified: Secondary | ICD-10-CM

## 2013-12-10 DIAGNOSIS — R5383 Other fatigue: Principal | ICD-10-CM

## 2013-12-10 MED ORDER — METOPROLOL SUCCINATE ER 25 MG PO TB24: 25.0000 mg | ORAL_TABLET | Freq: Every day | ORAL | Status: DC

## 2013-12-10 NOTE — Progress Notes (Signed)
Subjective:    Patient ID: Ronnie Martin, male    DOB: 11-13-1956, 58 y.o.   MRN: [id number]  HPI Fatigue - we did stop his metoprolol at last visit approximately 3-4 weeks ago. 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  He stopped his amlodipine. His blood pressures look great and we are adding a medication for his prostate and it was felt the metoprolol could be contributing to some of his fatigue. He is following up today.he is worried his bottom number is still high.   BPH-on Flomax. Tolerating it well without any side effects or problems.  He has a wart on the right inner thigh that he would like to have frozen today. He went online about the duct tape method and wonders if it could be helpful or not.  HTN - see note above.    Review of Systems     Objective:   Physical Exam  Constitutional: He is oriented to person, place, and time. He appears well-developed and well-nourished.  HENT:  Head: Normocephalic and atraumatic.  Cardiovascular: Normal rate, regular rhythm and normal heart sounds.   Pulmonary/Chest: Effort normal and breath sounds normal.  Neurological: He is alert and oriented to person, place, and time.  Skin: Skin is warm and dry.  Half a centimeter round tan wart versus seborrheic keratosis on the right inner thigh.  Psychiatric: He has a normal mood and affect. His behavior is normal.          Assessment & Plan:  Morris unclear etiology. I still think he could be a medication side effect. When I last saw him he was supposed to stop his metoprolol. Evidently this does help his tremor. I will see if we can decrease his dose to 25 mg instead of 50 mg and still help control his tremor but possibly help with any fatigue and low pulse. Most of the pulses on his blood pressure machine were in the 50s and low 60s. We will restart the amlodipine since this will  help greatly with lowering his diastolic pressure which is what he is most concerned with. He notices that when his diastolic pressure is 90 or higher he really doesn't feel well that day. We will go ahead and stop the losartan since we are making adjustments to his other medications. And his blood pressure otherwise looks great today.  HTN - well controlled.   BPH- tolerating Flomax well. May take several months to see full effect of medication.   Wart-cryotherapy performed. Please see procedure note.  Also reviewed how to use the duct tape method if he is interested.  Cryotherapy Procedure Note  Pre-operative Diagnosis: wart  Post-operative Diagnosis: wart  Locations: right inner thigh  Indications: irriated  Anesthesia: not required    Procedure Details  Patient informed of risks (permanent scarring, infection, light or dark discoloration, bleeding, infection, weakness, numbness and recurrence of the lesion) and benefits of the procedure and verbal informed consent obtained.  The areas are treated with liquid nitrogen therapy, frozen until ice ball extended 2 mm beyond lesion, allowed to thaw, and treated again. The patient tolerated procedure well.  The patient was instructed on post-op care, warned that there may be blister formation, redness and pain. Recommend OTC analgesia as needed for pain.  Condition: Stable  Complications: none.  Plan: 1. Instructed to keep the area dry and covered  for 24-48h and clean thereafter. 2. Warning signs of infection were reviewed.   3. Recommended that the patient use OTC acetaminophen as needed for pain.  4. Return prn

## 2013-12-10 NOTE — Patient Instructions (Signed)
Take the amlodipine in the AM and the metoprolol at bedtime.

## 2013-12-12 ENCOUNTER — Other Ambulatory Visit: Payer: Self-pay | Admitting: Internal Medicine

## 2013-12-12 DIAGNOSIS — C220 Liver cell carcinoma: Secondary | ICD-10-CM

## 2013-12-13 ENCOUNTER — Other Ambulatory Visit: Payer: Self-pay | Admitting: Internal Medicine

## 2013-12-13 ENCOUNTER — Ambulatory Visit (INDEPENDENT_AMBULATORY_CARE_PROVIDER_SITE_OTHER): Payer: 59

## 2013-12-13 DIAGNOSIS — K8020 Calculus of gallbladder without cholecystitis without obstruction: Secondary | ICD-10-CM

## 2013-12-13 DIAGNOSIS — C220 Liver cell carcinoma: Secondary | ICD-10-CM

## 2013-12-13 NOTE — Telephone Encounter (Signed)
Error

## 2013-12-18 ENCOUNTER — Telehealth: Payer: Self-pay

## 2013-12-18 MED ORDER — TADALAFIL 5 MG PO TABS: 5.0000 mg | ORAL_TABLET | Freq: Every day | ORAL | Status: DC

## 2013-12-18 NOTE — Telephone Encounter (Signed)
He states the flomax did not work for him. He would like to try the low dose cialis.

## 2013-12-18 NOTE — Telephone Encounter (Signed)
[place] new rx sent ot pharmacy.

## 2013-12-19 NOTE — Telephone Encounter (Signed)
Pt called back and informed that new rx has been sent.Ronnie Martin

## 2014-01-02 ENCOUNTER — Emergency Department (HOSPITAL_COMMUNITY): Payer: 59

## 2014-01-02 ENCOUNTER — Emergency Department (HOSPITAL_COMMUNITY): Payer: 59 | Admitting: Anesthesiology

## 2014-01-02 ENCOUNTER — Encounter (HOSPITAL_COMMUNITY): Payer: 59 | Admitting: Anesthesiology

## 2014-01-02 ENCOUNTER — Encounter (HOSPITAL_COMMUNITY): Payer: Self-pay | Admitting: Anesthesiology

## 2014-01-02 ENCOUNTER — Ambulatory Visit (HOSPITAL_COMMUNITY)
Admission: EM | Admit: 2014-01-02 | Discharge: 2014-01-03 | Disposition: A | Discharge status: Home / Self Care | Payer: 59 | Attending: Emergency Medicine | Admitting: Emergency Medicine

## 2014-01-02 ENCOUNTER — Encounter (HOSPITAL_COMMUNITY)
Admission: EM | Disposition: A | Discharge status: Home / Self Care | Payer: Self-pay | Source: Home / Self Care | Attending: Emergency Medicine

## 2014-01-02 DIAGNOSIS — Z7982 Long term (current) use of aspirin: Secondary | ICD-10-CM | POA: Insufficient documentation

## 2014-01-02 DIAGNOSIS — I10 Essential (primary) hypertension: Secondary | ICD-10-CM | POA: Insufficient documentation

## 2014-01-02 DIAGNOSIS — K219 Gastro-esophageal reflux disease without esophagitis: Secondary | ICD-10-CM | POA: Insufficient documentation

## 2014-01-02 DIAGNOSIS — B1920 Unspecified viral hepatitis C without hepatic coma: Secondary | ICD-10-CM | POA: Insufficient documentation

## 2014-01-02 DIAGNOSIS — Z860100 Personal history of colon polyps, unspecified: Secondary | ICD-10-CM | POA: Insufficient documentation

## 2014-01-02 DIAGNOSIS — T84029A Dislocation of unspecified internal joint prosthesis, initial encounter: Secondary | ICD-10-CM | POA: Insufficient documentation

## 2014-01-02 DIAGNOSIS — Z79899 Other long term (current) drug therapy: Secondary | ICD-10-CM | POA: Insufficient documentation

## 2014-01-02 DIAGNOSIS — Z87891 Personal history of nicotine dependence: Secondary | ICD-10-CM | POA: Insufficient documentation

## 2014-01-02 DIAGNOSIS — Z96649 Presence of unspecified artificial hip joint: Secondary | ICD-10-CM | POA: Insufficient documentation

## 2014-01-02 DIAGNOSIS — S73004A Unspecified dislocation of right hip, initial encounter: Secondary | ICD-10-CM

## 2014-01-02 DIAGNOSIS — J45909 Unspecified asthma, uncomplicated: Secondary | ICD-10-CM | POA: Insufficient documentation

## 2014-01-02 DIAGNOSIS — C3490 Malignant neoplasm of unspecified part of unspecified bronchus or lung: Secondary | ICD-10-CM | POA: Insufficient documentation

## 2014-01-02 DIAGNOSIS — I739 Peripheral vascular disease, unspecified: Secondary | ICD-10-CM | POA: Insufficient documentation

## 2014-01-02 DIAGNOSIS — X58XXXA Exposure to other specified factors, initial encounter: Secondary | ICD-10-CM | POA: Insufficient documentation

## 2014-01-02 HISTORY — PX: HIP CLOSED REDUCTION: SHX983

## 2014-01-02 SURGERY — CLOSED REDUCTION, HIP
Anesthesia: General | Site: Hip | Laterality: Right

## 2014-01-02 MED ORDER — ONDANSETRON HCL 4 MG/2ML IJ SOLN
INTRAMUSCULAR | Status: AC
  Filled 2014-01-02: qty 2

## 2014-01-02 MED ORDER — PROMETHAZINE HCL 25 MG/ML IJ SOLN: 6.2500 mg | INTRAMUSCULAR | Status: DC | PRN

## 2014-01-02 MED ORDER — MORPHINE SULFATE 10 MG/ML IJ SOLN: 1.0000 mg | INTRAMUSCULAR | Status: DC | PRN

## 2014-01-02 MED ORDER — LIDOCAINE HCL (CARDIAC) 20 MG/ML IV SOLN
INTRAVENOUS | Status: AC
  Filled 2014-01-02: qty 5

## 2014-01-02 MED ORDER — TRAMADOL-ACETAMINOPHEN 37.5-325 MG PO TABS
1.0000 | ORAL_TABLET | Freq: Four times a day (QID) | ORAL | Status: DC | PRN
Start: 2014-01-02 — End: 2014-02-25

## 2014-01-02 MED ORDER — PROPOFOL 10 MG/ML IV BOLUS
0.5000 mg/kg | Freq: Once | INTRAVENOUS | Status: DC
  Filled 2014-01-02: qty 1

## 2014-01-02 MED ORDER — SUCCINYLCHOLINE CHLORIDE 20 MG/ML IJ SOLN
INTRAMUSCULAR | Status: DC | PRN
  Administered 2014-01-02: 140 mg via INTRAVENOUS

## 2014-01-02 MED ORDER — PROPOFOL 10 MG/ML IV BOLUS
INTRAVENOUS | Status: AC
  Filled 2014-01-02: qty 20

## 2014-01-02 MED ORDER — FENTANYL CITRATE 0.05 MG/ML IJ SOLN
INTRAMUSCULAR | Status: AC
  Filled 2014-01-02: qty 2

## 2014-01-02 MED ORDER — PROPOFOL 10 MG/ML IV EMUL
INTRAVENOUS | Status: AC | PRN
  Administered 2014-01-02: 6 mL via INTRAVENOUS

## 2014-01-02 MED ORDER — LACTATED RINGERS IV SOLN: INTRAVENOUS | Status: DC

## 2014-01-02 MED ORDER — LIDOCAINE HCL (CARDIAC) 20 MG/ML IV SOLN
INTRAVENOUS | Status: DC | PRN
Start: 2014-01-02 — End: 2014-01-02
  Administered 2014-01-02: 60 mg via INTRAVENOUS

## 2014-01-02 MED ORDER — ONDANSETRON HCL 4 MG/2ML IJ SOLN
4.0000 mg | Freq: Once | INTRAMUSCULAR | Status: AC
  Administered 2014-01-02: 4 mg via INTRAVENOUS
  Filled 2014-01-02: qty 2

## 2014-01-02 MED ORDER — LACTATED RINGERS IV SOLN
INTRAVENOUS | Status: DC | PRN
  Administered 2014-01-02: 23:00:00 via INTRAVENOUS

## 2014-01-02 MED ORDER — PROPOFOL 10 MG/ML IV BOLUS
INTRAVENOUS | Status: DC | PRN
  Administered 2014-01-02: 200 mg via INTRAVENOUS

## 2014-01-02 MED ORDER — FENTANYL CITRATE 0.05 MG/ML IJ SOLN
INTRAMUSCULAR | Status: DC | PRN
  Administered 2014-01-02: 50 ug via INTRAVENOUS

## 2014-01-02 MED ORDER — MIDAZOLAM HCL 2 MG/2ML IJ SOLN
INTRAMUSCULAR | Status: AC
  Filled 2014-01-02: qty 2

## 2014-01-02 MED ORDER — FENTANYL CITRATE 0.05 MG/ML IJ SOLN
100.0000 ug | Freq: Once | INTRAMUSCULAR | Status: AC
  Administered 2014-01-02: 100 ug via INTRAVENOUS
  Filled 2014-01-02: qty 2

## 2014-01-02 MED ORDER — KETAMINE HCL 10 MG/ML IJ SOLN
1.0000 mg/kg | Freq: Once | INTRAMUSCULAR | Status: DC
  Filled 2014-01-02: qty 8.9

## 2014-01-02 MED ORDER — KETAMINE HCL 10 MG/ML IJ SOLN
INTRAMUSCULAR | Status: AC | PRN
  Administered 2014-01-02: 88.5 mg via INTRAVENOUS

## 2014-01-02 MED ORDER — SODIUM CHLORIDE 0.9 % IV SOLN
INTRAVENOUS | Status: DC
  Administered 2014-01-02: 20:00:00 via INTRAVENOUS

## 2014-01-02 NOTE — ED Notes (Signed)
Pt was getting off of toilet and R hip felt like it dislocated. Hx of 2 hip replacements. Latest was approx. 1 1/2 years ago.

## 2014-01-02 NOTE — ED Notes (Signed)
Pt taken to OR.

## 2014-01-02 NOTE — Transfer of Care (Signed)
Immediate Anesthesia Transfer of Care Note  Patient: Ronnie Martin  Procedure(s) Performed: Procedure(s): CLOSED REDUCTION HIP (Right)  Patient Location: PACU  Anesthesia Type:General  Level of Consciousness: awake, alert  and oriented  Airway & Oxygen Therapy: Patient Spontanous Breathing and Patient connected to face mask oxygen  Post-op Assessment: Report given to PACU RN and Post -op Vital signs reviewed and stable  Post vital signs: Reviewed and stable  Complications: No apparent anesthesia complications

## 2014-01-02 NOTE — ED Notes (Signed)
Pt's wife, Marlowe Kays, states she is POA and would like to be called with any information.

## 2014-01-02 NOTE — H&P (Signed)
  Ronnie Martin is an 58 y.o. male.    Chief Complaint: Dislocated right total hip   HPI: Pt is a 58 y.o. 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  He was taken to the ER.  They attempted to reduce it but were unable. Radiographs revealed an anterior dislocation of the hip  Seen and evaluated in ER  No other injuries to report Pain in hip   PCP:  METHENEY,CATHERINE, MD  D/C Plans: To be determined following appropriate treatment plan  PMH: Past Medical History  Diagnosis Date  . Hypertension   . Carcinoma of lung   . Hepatitis C   . GERD (gastroesophageal reflux disease)   . Diverticulosis of colon   . Colon polyps     history of polyps  . Hx of cardiovascular stress test     ETT/Lexiscan Myoview 8/14:  Low risk; small mild basal to mid inferior defect-probably represents diaphragmatic attenuation with normal wall motion, no ischemia, EF 68%  . Asthma     PSH: Past Surgical History  Procedure Laterality Date  . Forearm surgery    . Hip surgery  2006    right hip  . Lung lobectomy  11/24/06  . 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     Social History:  reports that he quit smoking about 4 years ago. His smoking use included Cigarettes. He smoked 0.00 packs per day. He has never used smokeless tobacco. He reports that he does not drink alcohol or use illicit drugs.  Allergies:  Allergies  Allergen Reactions  . Codeine     REACTION: nausea  . Concerta [Methylphenidate] Other (See Comments)    Elevated BP  . Lisinopril Cough  . Sertraline Nausea Only    Medications:  (Not in a hospital admission)  No results found for this or any previous visit (from the past 48 hour(s)). Dg Hip Complete Right  01/02/2014   CLINICAL DATA:  Right hip pain, prior arthroplasty  EXAM: RIGHT HIP -  COMPLETE 2+ VIEW  COMPARISON:  03/08/2011  FINDINGS: Dislocation of the right hip prosthesis noted anteriorly. Femoral component is positioned anterior to the acetabular cup on the cross-table lateral view. Pelvis intact. Left hip aligned. No diastasis or acute osseous finding.  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   On: 01/02/2014 19:48    ROS: Review of Systems - Negative except for admission issue  Physican Exam: Blood pressure 133/84, pulse 68, temperature 97.7 F (36.5 C), temperature source Oral, resp. rate 9, weight 88.451 kg (195 lb), SpO2 100.00%. .physicalexam  Right lower extremity externally rotated NVI RLE  Otherwise normal exam as appreciated by Emergency room evaluation   Assessment/Plan Assessment:  Right total hip dislocation (anterior)   Plan: Patient will undergo a closed reduction of his right total hip dislocation tonight int the OR, 1/28. Risks benefits and expectation were discussed with the patient. Patient understand risks, benefits and expectation and wishes to proceed.  Plan to go home afterwards, will review with wife WBAT Return to clinic in 2 weeks   Pietro Cassis. Alvan Dame, MD  01/02/2014, 10:36 PM

## 2014-01-02 NOTE — Brief Op Note (Signed)
01/02/2014  11:34 PM  PATIENT:  Ronnie Martin  58 y.o. male  PRE-OPERATIVE DIAGNOSIS:  Dislocated right total hip  POST-OPERATIVE DIAGNOSIS:   Dislocated right total hip  PROCEDURE:  Procedure(s): CLOSED REDUCTION HIP (Right)  SURGEON:  Surgeon(s) and Role:    * Mauri Pole, MD - Primary  PHYSICIAN ASSISTANT: None  ANESTHESIA:   general  EBL:  Total I/O In: 500 [I.V.:500] Out: -   BLOOD ADMINISTERED:none  DRAINS: none   LOCAL MEDICATIONS USED:  NONE  SPECIMEN:  No Specimen  DISPOSITION OF SPECIMEN:  N/A  COUNTS:  NO closed case  TOURNIQUET:  * No tourniquets in log *  DICTATION: .Other Dictation: Dictation Number [id number]  PLAN OF CARE: Discharge to home after PACU  PATIENT DISPOSITION:  PACU - hemodynamically stable.   Delay start of Pharmacological VTE agent (>24hrs) due to surgical blood loss or risk of bleeding: not applicable

## 2014-01-02 NOTE — Anesthesia Preprocedure Evaluation (Addendum)
Anesthesia Evaluation  Patient identified by MRN, date of birth, ID band Patient awake    Reviewed: Allergy & Precautions, H&P , NPO status , Patient's Chart, lab work & pertinent test results  Airway Mallampati: II TM Distance: >3 FB Neck ROM: Full    Dental  (+) Partial Lower, Poor Dentition and Dental Advisory Given   Pulmonary neg pulmonary ROS, shortness of breath, asthma , former smoker,  Hx of lung neoplasm; s/p lobectomy. breath sounds clear to auscultation  Pulmonary exam normal       Cardiovascular hypertension, Pt. on medications and Pt. on home beta blockers + Peripheral Vascular Disease Rhythm:Regular Rate:Normal     Neuro/Psych negative neurological ROS  negative psych ROS   GI/Hepatic Neg liver ROS, GERD-  ,(+)     substance abuse   , Hepatitis -, C and B  Endo/Other  negative endocrine ROS  Renal/GU negative Renal ROS  negative genitourinary   Musculoskeletal negative musculoskeletal ROS (+)   Abdominal   Peds negative pediatric ROS (+)  Hematology negative hematology ROS (+)   Anesthesia Other Findings   Reproductive/Obstetrics                          Anesthesia Physical Anesthesia Plan  ASA: III and emergent  Anesthesia Plan: General   Post-op Pain Management:    Induction: Intravenous, Rapid sequence and Cricoid pressure planned  Airway Management Planned: Oral ETT  Additional Equipment:   Intra-op Plan:   Post-operative Plan: Extubation in OR  Informed Consent: I have reviewed the patients History and Physical, chart, labs and discussed the procedure including the risks, benefits and alternatives for the proposed anesthesia with the patient or authorized representative who has indicated his/her understanding and acceptance.   Dental advisory given  Plan Discussed with: CRNA  Anesthesia Plan Comments: (Patient ate at 1800 tonight. )         Anesthesia Quick Evaluation

## 2014-01-02 NOTE — Preoperative (Signed)
Beta Blockers   Reason not to administer Beta Blockers:Will monitor and give beta blocker as necessary

## 2014-01-02 NOTE — ED Provider Notes (Signed)
CSN: [id number]     Arrival date & time 01/02/14  1858 History   First MD Initiated Contact with Patient 01/02/14 1906     Chief Complaint  Patient presents with  . Hip Injury   (Consider location/radiation/quality/duration/timing/severity/associated sxs/prior Treatment) HPI  58 year old male with right hip pain. is arrival. Patient was getting up off the toilet when he felt a pop in his right hip. He states that he was in the process of pulling up his pants, standing on his toes on his right foot slightly pivoting when this happened. Persistent pain since then. Status post hip replacement revision. Most recent procedure approximately 18 months ago. No history of prior dislocation. No numbness or tingling. Denies pain anywhere else.  Past Medical History  Diagnosis Date  . Hypertension   . Carcinoma of lung   . Hepatitis C   . GERD (gastroesophageal reflux disease)   . Diverticulosis of colon   . Colon polyps     history of polyps  . Hx of cardiovascular stress test     ETT/Lexiscan Myoview 8/14:  Low risk; small mild basal to mid inferior defect-probably represents diaphragmatic attenuation with normal wall motion, no ischemia, EF 68%  . Asthma    Past Surgical History  Procedure Laterality Date  . Forearm surgery    . Hip surgery  2006    right hip  . Lung lobectomy  11/24/06  . 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    Family History  Problem Relation Age of Onset  . Rectal cancer Father   . Cancer Father     brain tumos  . Cancer Mother     breast   History  Substance Use Topics  . Smoking status: Former Smoker    Types: Cigarettes    Quit date: 12/06/2009  . Smokeless tobacco: Never Used  . Alcohol Use: No    Review of Systems  All systems reviewed and negative, other than as noted in HPI.   Allergies  Codeine; Concerta; Lisinopril; and Sertraline  Home Medications   Current Outpatient Rx  Name  Route  Sig  Dispense  Refill  .  amLODipine (NORVASC) 10 MG tablet   Oral   Take 1 tablet (10 mg total) by mouth daily.   30 tablet   1   . ANDROGEL PUMP 20.25 MG/ACT (1.62%) GEL      APPLY 3 PUMPS TO SKIN DAILY   300 g   3     Dispense as written.   Marland Kitchen aspirin 81 MG tablet   Oral   Take 81 mg by mouth daily.           . clonazePAM (KLONOPIN) 0.5 MG tablet   Oral   Take 1 tablet (0.5 mg total) by mouth 2 (two) times daily as needed for anxiety.   30 tablet   0   . FLUoxetine (PROZAC) 40 MG capsule   Oral   Take 1 capsule (40 mg total) by mouth daily.   30 capsule   1   . fluticasone (FLONASE) 50 MCG/ACT nasal spray   Nasal   Place 2 sprays into the nose daily.   16 g   3   . ibuprofen (ADVIL,MOTRIN) 200 MG tablet   Oral   Take 600 mg by mouth every 6 (six) hours as needed for moderate pain.          . metoprolol succinate (TOPROL-XL) 25 MG 24 hr tablet  Oral   Take 1 tablet (25 mg total) by mouth at bedtime.   90 tablet   0   . pantoprazole (PROTONIX) 40 MG tablet      TAKE 1 TABLET BY MOUTH ONCE DAILY   90 tablet   0     Needs f/u appt   . tadalafil (CIALIS) 5 MG tablet   Oral   Take 1 tablet (5 mg total) by mouth daily.   30 tablet   6   . tamsulosin (FLOMAX) 0.4 MG CAPS capsule   Oral   Take 1 capsule (0.4 mg total) by mouth daily after supper.   30 capsule   2    BP 133/84  Pulse 68  Temp(Src) 97.7 F (36.5 C) (Oral)  Resp 9  Wt 195 lb (88.451 kg)  SpO2 100% Physical Exam  Nursing note and vitals reviewed. Constitutional: He appears well-developed and well-nourished. No distress.  HENT:  Head: Normocephalic and atraumatic.  Eyes: Conjunctivae are normal. Right eye exhibits no discharge. Left eye exhibits no discharge.  Neck: Neck supple.  Cardiovascular: Normal rate, regular rhythm and normal heart sounds.  Exam reveals no gallop and no friction rub.   No murmur heard. Pulmonary/Chest: Effort normal and breath sounds normal. No respiratory distress.   Abdominal: Soft. He exhibits no distension. There is no tenderness.  Musculoskeletal: He exhibits no edema and no tenderness.  R  Lower extremity does not appear to be shortened. Pelvis and slight external rotation. Severe pain with any attempted range of motion the right hip. Neurovascularly intact distally.  Neurological: He is alert.  Skin: Skin is warm and dry.  Psychiatric: He has a normal mood and affect. His behavior is normal. Thought content normal.    ED Course  Reduction of dislocation Date/Time: 01/02/2014 9:00 PM Performed by: Virgel Manifold Authorized by: Virgel Manifold Consent: written consent obtained. Risks and benefits: risks, benefits and alternatives were discussed Consent given by: patient Patient identity confirmed: verbally with patient, arm band and provided demographic data Time out: Immediately prior to procedure a "time out" was called to verify the correct patient, procedure, equipment, support staff and site/side marked as required. Local anesthesia used: no Patient sedated: yes Sedation type: moderate (conscious) sedation Sedatives: propofol and ketamine Vitals: Vital signs were monitored during sedation. Comments: Attempts unsuccessful. Assistant applied lateral traction. Attempted reduction with longitudinal traction and flexing hip and then adducting and internally rotating.     Procedural Sedation  Preprocedure  Pre-anesthesia/induction confirmation of laterality/correct procedure site including "time-out."  Provider confirms review of the nurses' note, allergies, medications, pertinent labs, PMH, pre-induction vital signs, pulse oximetry, pain level, and ECG (as applicable), and patient condition satisfactory for commencing with order for sedation and procedure.  Total time of sedation/monitoring: 35 minutes  Patient tolerated procedure and procedural sedation component as expected without apparent immediate complications.  Physician confirms  procedural medication orders as administered, patient was assessed by physician post-procedure, and confirms post-sedation plan of care and disposition.   Labs Review Labs Reviewed - No data to display Imaging Review Dg Hip Complete Right  01/02/2014   CLINICAL DATA:  Right hip pain, prior arthroplasty  EXAM: RIGHT HIP - COMPLETE 2+ VIEW  COMPARISON:  03/08/2011  FINDINGS: Dislocation of the right hip prosthesis noted anteriorly. Femoral component is positioned anterior to the acetabular cup on the cross-table lateral view. Pelvis intact. Left hip aligned. No diastasis or acute osseous finding.  IMPRESSION: Anteriorly dislocated right hip femoral prosthetic component  Electronically Signed   By: Daryll Brod M.D.   On: 01/02/2014 19:48    in EKG Interpretation   None       MDM   1. Closed dislocation of right hip      9:58 PM Attempted reduction in ED w/o success. Discussed with Dr Alvan Dame, ortho. Will eval pt. Likely take to OR.   Virgel Manifold, MD 01/03/14 [phone]

## 2014-01-03 ENCOUNTER — Encounter (HOSPITAL_COMMUNITY): Payer: Self-pay | Admitting: Orthopedic Surgery

## 2014-01-03 NOTE — Op Note (Signed)
NAMEPETR, BONTEMPO NO.:  [id number]  MEDICAL RECORD NO.:  [id number]  LOCATION:  [facility]                         FACILITY:  [facility]  PHYSICIAN:  Pietro Cassis. Alvan Dame, M.D.  DATE OF BIRTH:  08/04/1956  DATE OF PROCEDURE:  01/02/2014 DATE OF DISCHARGE:                              OPERATIVE REPORT   PREOPERATIVE DIAGNOSES: 1. Right total hip replacement dislocation. 2. Anterior right total hip dislocation.  PROCEDURE:  Closed reduction of right total hip arthroplasty.  SURGEON:  Pietro Cassis. Alvan Dame, M.D.  ASSISTANT:  Surgical team.  ANESTHESIA:  General.  SPECIMENS:  None.  COMPLICATIONS:  None.  INDICATION OF PROCEDURE:  Mr. Mathenia is a pleasant 58 year old patient of mine who approximately 19 months ago, underwent a revision of a right total hip replaced through the posterior approach.  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  I had actually seen him in the halls within last week stating that he had been doing very well with his hip and he was very pleased with it.  He unfortunately had an incident at work when he was trying to get up from a seated position, and a quick movement where he lifted up onto his toes, and he felt that excruciating pain and popping the hip and inability to bear weight, crumbled to the ground.  He was taken to the emergency room at the hospital where radiographs revealed an anterior hip dislocation.  After the emergency room physicians had initially tried to reduce his hip, Orthopedic was consulted, and I had a chance to review his radiographs, show no evidence of any fractures associated with dislocation.  After reviewing his current situation, recommending going to the operating room at this point under general anesthesia to help to reduce his hip.  Risks, benefits, necessity reviewed.  Consent was obtained for the benefit of management of his acute  dislocation.  PROCEDURE IN DETAIL:  The patient was brought to the operative theater. Once adequate anesthesia was established, a time-out was performed. Upon induction of anesthesia, there was a relax movement.  Given the anterior hip dislocation, I basically pulled in-line traction and internally rotated the hip and felt in sense the reduction of the hip. Radiographs were obtained in the operating room, confirming reduction of the hip concentrically.  The hip moved fluidly throughout a range of motion particularly hip flexion and internal rotation.  I did not stress the hip for repeat dislocation.  I had placed a knee immobilizer just from waking up standpoint.  He was then woken from anesthesia and brought to the recovery room in stable condition.  The plan will be for his wife to take him up at the hospital.  I am going to see him back in office in 2 weeks.  He will likely not to be returned back to work or on stressful environment until I see him back in 2 weeks to make sure he is doing well.  I reviewed that he needs to rest his hip for the next 4-8 weeks to allow it to heal up adequately to prevent any recurrence of problem easily.  Difficult to me explaining the source or the mechanism of his dislocation unless this is occurred and one needed rest to prevent any further recurrence.     Pietro Cassis Alvan Dame, M.D.     MDO/MEDQ  D:  01/02/2014  T:  01/03/2014  Job:  [id number]

## 2014-01-07 NOTE — Anesthesia Postprocedure Evaluation (Signed)
Anesthesia Post Note  Patient: Ronnie Martin  Procedure(s) Performed: Procedure(s) (LRB): CLOSED REDUCTION HIP (Right)  Anesthesia type: General  Patient location: PACU  Post pain: Pain level controlled  Post assessment: Post-op Vital signs reviewed  Last Vitals:  Filed Vitals:   01/03/14 0030  BP: 110/69  Pulse: 72  Temp: 36.3 C  Resp: 14    Post vital signs: Reviewed  Level of consciousness: sedated  Complications: No apparent anesthesia complications

## 2014-01-08 ENCOUNTER — Ambulatory Visit: Payer: 59 | Admitting: Family Medicine

## 2014-01-09 ENCOUNTER — Telehealth: Payer: Self-pay | Admitting: *Deleted

## 2014-01-09 NOTE — Telephone Encounter (Signed)
Pt lvm stating that he was in [facility] and he only had a few prozac left and has been taken every other day and he wanted to know if he can d/c this med and also he has another medication that is causing him to yawn a lot. He's unsure of what med it is. He has an appt on Monday.Ronnie Martin

## 2014-01-10 NOTE — Telephone Encounter (Signed)
Yes. He stated that he is still taking the Klonopin prn. And he would like to get off of prozac.Audelia Hives Marana

## 2014-01-10 NOTE — Telephone Encounter (Signed)
I didn't know he was tapering off. I am very concerned his aniseyt level is going to be out of control.

## 2014-01-14 ENCOUNTER — Encounter: Payer: Self-pay | Admitting: Physician Assistant

## 2014-01-14 ENCOUNTER — Ambulatory Visit: Payer: 59 | Admitting: Family Medicine

## 2014-01-14 ENCOUNTER — Ambulatory Visit (INDEPENDENT_AMBULATORY_CARE_PROVIDER_SITE_OTHER): Payer: 59 | Admitting: Physician Assistant

## 2014-01-14 VITALS — BP 117/72 | HR 63 | Wt 208.0 lb

## 2014-01-14 DIAGNOSIS — N138 Other obstructive and reflux uropathy: Secondary | ICD-10-CM

## 2014-01-14 DIAGNOSIS — N401 Enlarged prostate with lower urinary tract symptoms: Secondary | ICD-10-CM

## 2014-01-14 DIAGNOSIS — K0889 Other specified disorders of teeth and supporting structures: Secondary | ICD-10-CM

## 2014-01-14 DIAGNOSIS — K0510 Chronic gingivitis, plaque induced: Secondary | ICD-10-CM | POA: Insufficient documentation

## 2014-01-14 DIAGNOSIS — N529 Male erectile dysfunction, unspecified: Secondary | ICD-10-CM

## 2014-01-14 DIAGNOSIS — K029 Dental caries, unspecified: Secondary | ICD-10-CM

## 2014-01-14 DIAGNOSIS — I10 Essential (primary) hypertension: Secondary | ICD-10-CM

## 2014-01-14 DIAGNOSIS — K089 Disorder of teeth and supporting structures, unspecified: Secondary | ICD-10-CM

## 2014-01-14 MED ORDER — METOPROLOL SUCCINATE ER 25 MG PO TB24: 25.0000 mg | ORAL_TABLET | Freq: Every day | ORAL | Status: DC

## 2014-01-14 MED ORDER — CLONAZEPAM 0.5 MG PO TABS: 0.5000 mg | ORAL_TABLET | Freq: Two times a day (BID) | ORAL | Status: DC | PRN

## 2014-01-14 MED ORDER — HYDROCODONE-ACETAMINOPHEN 5-325 MG PO TABS: 1.0000 | ORAL_TABLET | Freq: Three times a day (TID) | ORAL | Status: DC | PRN

## 2014-01-14 MED ORDER — BUPROPION HCL ER (XL) 150 MG PO TB24: 150.0000 mg | ORAL_TABLET | Freq: Every day | ORAL | Status: DC

## 2014-01-14 MED ORDER — AMLODIPINE BESYLATE 10 MG PO TABS: 10.0000 mg | ORAL_TABLET | Freq: Every day | ORAL | Status: DC

## 2014-01-14 MED ORDER — TAMSULOSIN HCL 0.4 MG PO CAPS: 0.4000 mg | ORAL_CAPSULE | Freq: Every day | ORAL | Status: DC

## 2014-01-14 NOTE — Progress Notes (Signed)
   Subjective:    Patient ID: Ronnie Martin, male    DOB: [birth year], 58 y.o.   MRN: [id number]  HPI Patient presents to the clinic to followup on BPH and hypertension. BPH-patient was switched to Cialis daily and he feels like his symptoms have started coming back to where they were before starting medication. He had stopped Flomax because he felt like it was making him too dry. He would like to go back on Flomax today. He feels like when he was on Flomax his symptoms were much improved. He would like to stay on Cialis as needed for erectile dysfunction.  Hypertension-blood pressure looks great today however patient is concerned that blood pressure is elevated in the mornings. He also reports that he is very anxious in the morning. He stopped Prozac because he was yawning all day long as well as had a home GERD feeling in his belly. Once he stopped the Prozac although symptoms when away.  Patient also complains of right-sided upper molar tooth pain today. He has a history of gingivitis and dental.. He has an appointment with a dentist in the morning. He is in significant 7/10 pain. He would like something to help with pain until he sees the dentist.     Review of Systems     Objective:   Physical Exam  Constitutional: He is oriented to person, place, and time. He appears well-developed and well-nourished.  HENT:  Head: Normocephalic and atraumatic.  Mouth/Throat:    Cardiovascular: Normal rate, regular rhythm and normal heart sounds.   Pulmonary/Chest: Effort normal and breath sounds normal.  Neurological: He is alert and oriented to person, place, and time.  Skin: Skin is warm and dry.  Psychiatric: He has a normal mood and affect. His behavior is normal.          Assessment & Plan:  BPH/ED- AUA was 28. Symptoms are certainly not controlled. Per patient he would like to go back on Flomax and stop daily Cialis. He had more symptomatic relief with Flomax. He would like to  continue Cialis as needed for erectile dysfunction. Cialis didn't work well for that. Restarted Flomax today. Stop Cialis daily and will see if we have samples for Cialis as needed for EGD. Ordered PSA today.  HTN- looks great today. Stay on current regiment. Pt still complains of elevated blood pressure in am. He is also very anxious in am. I feel like  Episodic BP elevations could be due to stress.   Anxiety- patient weaned himself off Prozac. Discussed with patient that I felt like she needed to be on something for anxiety. Patient seems very anxious in exam room today. We decided to try Wellbutrin 150 mg in the morning. Followup in 4-6 weeks with Dr. Charise Carwin to recheck.  Tooth pain/gingivitis dental caries - patient has 2 upper right molars that are very painful. He is due to see the dentist in the morning. He would like something for pain and to get him to that visit. Hydrocodone 5/325 quantity 10 was given to patient.

## 2014-01-15 LAB — PSA: PSA: 0.46 ng/mL (ref <=4.00)

## 2014-01-25 ENCOUNTER — Telehealth: Payer: Self-pay | Admitting: *Deleted

## 2014-01-25 NOTE — Telephone Encounter (Signed)
Pt called and stated that he will be starting a medication that is going to eliminate Hep C from his body. He said that when he spoke to the pharmacist they told him that it causes insomnia and its preferred not to take the medication for acid reflex. He stated that he was ok with making dietary changes however he would like to have something to help him to sleep. He stated that his wife takes Trazodone, I advised that he try Melatonin and to start at 3 mg and if needed he can work up to 10 if needed. Pt voiced understanding and agreed. Maryruth Eve, Lahoma Crocker

## 2014-02-15 ENCOUNTER — Other Ambulatory Visit: Payer: Self-pay | Admitting: *Deleted

## 2014-02-15 ENCOUNTER — Other Ambulatory Visit: Payer: Self-pay | Admitting: Family Medicine

## 2014-02-15 MED ORDER — TAMSULOSIN HCL 0.4 MG PO CAPS: 0.4000 mg | ORAL_CAPSULE | Freq: Every day | ORAL | Status: DC

## 2014-02-15 MED ORDER — AMLODIPINE BESYLATE 10 MG PO TABS
10.0000 mg | ORAL_TABLET | Freq: Every day | ORAL | Status: DC
Start: 2014-02-15 — End: 2014-05-07

## 2014-02-15 MED ORDER — METOPROLOL SUCCINATE ER 25 MG PO TB24
25.0000 mg | ORAL_TABLET | Freq: Every day | ORAL | Status: DC
Start: 2014-02-15 — End: 2014-11-13

## 2014-02-15 MED ORDER — BUPROPION HCL ER (XL) 150 MG PO TB24: 150.0000 mg | ORAL_TABLET | Freq: Every day | ORAL | Status: DC

## 2014-02-15 MED ORDER — CLONAZEPAM 0.5 MG PO TABS: 0.5000 mg | ORAL_TABLET | Freq: Two times a day (BID) | ORAL | Status: DC | PRN

## 2014-02-25 ENCOUNTER — Ambulatory Visit (INDEPENDENT_AMBULATORY_CARE_PROVIDER_SITE_OTHER): Payer: 59 | Admitting: Family Medicine

## 2014-02-25 ENCOUNTER — Encounter: Payer: Self-pay | Admitting: Family Medicine

## 2014-02-25 VITALS — BP 115/68 | HR 78 | Temp 97.6°F | Resp 18 | Ht 71.0 in | Wt 216.8 lb

## 2014-02-25 DIAGNOSIS — F411 Generalized anxiety disorder: Secondary | ICD-10-CM | POA: Insufficient documentation

## 2014-02-25 DIAGNOSIS — R0602 Shortness of breath: Secondary | ICD-10-CM

## 2014-02-25 DIAGNOSIS — E785 Hyperlipidemia, unspecified: Secondary | ICD-10-CM

## 2014-02-25 DIAGNOSIS — N138 Other obstructive and reflux uropathy: Secondary | ICD-10-CM

## 2014-02-25 DIAGNOSIS — B182 Chronic viral hepatitis C: Secondary | ICD-10-CM

## 2014-02-25 DIAGNOSIS — N401 Enlarged prostate with lower urinary tract symptoms: Secondary | ICD-10-CM

## 2014-02-25 NOTE — Patient Instructions (Signed)
Dutasteride capsules What is this medicine? DUTASTERIDE (doo TAS teer ide) is used to treat benign prostatic hyperplasia (BPH) in men. This is a condition that causes you to have an enlarged prostate. This medicine helps to control your symptoms, decrease urinary retention, and reduces your risk of needing surgery. This medicine may be used for other purposes; ask your health care provider or pharmacist if you have questions. COMMON BRAND NAME(S): Avodart What should I tell my health care provider before I take this medicine? They need to know if you have any of these conditions: -liver disease -prostate cancer -an unusual or allergic reaction to dutasteride, finasteride, other medicines, foods, dyes, or preservatives -pregnant or trying to get pregnant -breast-feeding How should I use this medicine? Take this medicine by mouth with a glass of water. Follow the directions on the prescription label. Do not cut, crush, chew or open this medicine. You can take this medicine with or without food. Take your doses at regular intervals. Do not take your medicine more often than directed. Do not stop taking except on your doctor's advice. Talk to your pediatrician regarding the use of this medicine in children. Special care may be needed. Overdosage: If you think you have taken too much of this medicine contact a poison control center or emergency room at once. NOTE: This medicine is only for you. Do not share this medicine with others. What if I miss a dose? If you miss a dose, take it as soon as you can. If it is almost time for your next dose, take only that dose. Do not take double or extra doses. What may interact with this medicine? -cimetidine -ciprofloxacin -clarithromycin, erythromycin, or troleandomycin -diltiazem, nicardipine, or verapamil -some antifungal medicines like ketoconazole, itraconazole, voriconazole -some medicines for HIV This list may not describe all possible interactions.  Give your health care provider a list of all the medicines, herbs, non-prescription drugs, or dietary supplements you use. Also tell them if you smoke, drink alcohol, or use illegal drugs. Some items may interact with your medicine. What should I watch for while using this medicine? Do not donate blood while you are taking this medicine. This will prevent giving this medicine to a pregnant male through a blood transfusion. Ask your doctor or health care professional when it is safe to donate blood after you stop taking this medicine. Contact your doctor or health care professional if your symptoms do not start to get better. You may need to take this medicine for 6 to 12 months to get the best results. Women who are pregnant or may get pregnant must not handle this medicine. The active ingredient could harm the unborn baby. If a pregnant woman or woman who may become pregnant comes into contact with a leaking capsule, she should wash the exposed area of skin with soap and water immediately and check with her doctor or health care professional. This medicine can interfere with PSA laboratory tests for prostate cancer. If you are scheduled to have a lab test for prostate cancer, tell your doctor or health care professional that you are taking this medicine. What side effects may I notice from receiving this medicine? Side effects that you should report to your doctor or health care professional as soon as possible: -allergic reactions like skin rash, itching or hives, swelling of the face, lips, or tongue -changes in breast like lumps, pain or fluids leaking from the nipple -pain in the testicles Side effects that usually do not require medical  attention (report to your doctor or health care professional if they continue or are bothersome): -change in sex drive or performance This list may not describe all possible side effects. Call your doctor for medical advice about side effects. You may report side  effects to FDA at 1-800-FDA-1088. Where should I keep my medicine? Keep out of the reach of children. Store at room temperature between 15 and 30 degrees C (59 and 86 degrees F). Keep container tightly closed. Throw away any unused medicine after the expiration date. NOTE: This sheet is a summary. It may not cover all possible information. If you have questions about this medicine, talk to your doctor, pharmacist, or health care provider.  2014, Elsevier/Gold Standard. (2008-02-02 16:48:51)

## 2014-02-25 NOTE — Progress Notes (Signed)
   Subjective:    Patient ID: Ronnie Martin, male    DOB: 1956-05-25, 59 y.o.   MRN: [id number]  HPI BPH - Switched back to flomax x 1 month ago. He says he would like to switch back to the Cialis at some point in time.  He says the flomax does "dry him out".    Anxiety - Started on Wellbutrin 1 mo ago. Has been using the klonopin. He needs a refill.  Says his anxiety then affects his sleep   Quit drinking caffeine.  Stopped a couple of months ago. BP has been well controlled.    Still SOB.  Noticed it when he bends over notices a grumbling in his lungs at times. He is a former smoker.    He did start [place] for his Hepatitis C.    Review of Systems     Objective:   Physical Exam  Constitutional: He is oriented to person, place, and time. He appears well-developed and well-nourished.  HENT:  Head: Normocephalic and atraumatic.  Cardiovascular: Normal rate, regular rhythm and normal heart sounds.   Pulmonary/Chest: Effort normal and breath sounds normal.  Neurological: He is alert and oriented to person, place, and time.  Skin: Skin is warm and dry.  Psychiatric: He has a normal mood and affect. His behavior is normal.          Assessment & Plan:  BPH - Under fair control on current regimen. He say she may want to change back to Cialis at some point.  Consider adding reductase inhibitor to his regimen. Given a handout on Avodart. I would certainly have to cross reference and make sure that this is okay with his Harvoni.  GAD- doing well on Wellbutrin.  Gad 7 score of 6 today. Continue current regimen. Klonopin was actually refilled last week. Call the pharmacy and they do have her ready for him.  SOB - encouraged him to consider spirometry for further evaluation. He thinks could be helpful especially being a former smoker. I am very proud of him that he has quit. The shortness of breath with bending over is likely related to excess weight and his abdominal area.  Hep C - On  Harvoni and doing well.

## 2014-02-26 ENCOUNTER — Telehealth: Payer: Self-pay | Admitting: *Deleted

## 2014-02-26 NOTE — Telephone Encounter (Signed)
Pt calling asking for samples of cialis. Did not have any sample to give to pt.Audelia Hives [place]

## 2014-03-07 LAB — LIPID PANEL
CHOLESTEROL: 209 mg/dL — AB (ref 0–200)
HDL: 36 mg/dL — ABNORMAL LOW (ref >39)
LDL Cholesterol: 146 mg/dL — ABNORMAL HIGH (ref 0–99)
TRIGLYCERIDES: 133 mg/dL (ref <150)
Total CHOL/HDL Ratio: 5.8 Ratio
VLDL: 27 mg/dL (ref 0–40)

## 2014-04-20 ENCOUNTER — Emergency Department
Admission: EM | Admit: 2014-04-20 | Discharge: 2014-04-20 | Disposition: A | Discharge status: Home / Self Care | Payer: 59 | Source: Home / Self Care | Attending: Family Medicine | Admitting: Family Medicine

## 2014-04-20 ENCOUNTER — Encounter: Payer: Self-pay | Admitting: Emergency Medicine

## 2014-04-20 DIAGNOSIS — J069 Acute upper respiratory infection, unspecified: Secondary | ICD-10-CM

## 2014-04-20 MED ORDER — METHYLPREDNISOLONE ACETATE 80 MG/ML IJ SUSP: 80.0000 mg | Freq: Once | INTRAMUSCULAR | Status: DC

## 2014-04-20 NOTE — ED Notes (Signed)
Ronnie Martin complains of cough with yellow sputum, body aches, chills and hoarseness for 2 days. He also reports diarrhea this morning. Denies fever or sweats.

## 2014-04-20 NOTE — ED Provider Notes (Signed)
CSN: [id number]     Arrival date & time 04/20/14  0906 History   First MD Initiated Contact with Patient 04/20/14 0914     Chief Complaint  Patient presents with  . Cough  . Generalized Body Aches    HPI  URI Symptoms Onset: 2 days  Description: rhinorrhea, nasal congestion, sore throat, cough  Modifying factors:  Baseline allergic rhinitis, former smoker in the remote past   Symptoms Nasal discharge: yes Fever: no Sore throat: minimal  Cough: yes Wheezing: no Ear pain: no GI symptoms: no Sick contacts: works in hospital   [employer]  Stiff neck: no Dyspnea: no Rash: n Swallowing difficulty: ono  Sinusitis Risk Factors Headache/face pain: no Double sickening: no tooth pain: no  Allergy Risk Factors Sneezing: yes Itchy scratchy throat: yes Seasonal symptoms: yes  Flu Risk Factors Headache: no muscle aches: no severe fatigue: no   Past Medical History  Diagnosis Date  . Hypertension   . Carcinoma of lung   . Hepatitis C   . GERD (gastroesophageal reflux disease)   . Diverticulosis of colon   . Colon polyps     history of polyps  . Hx of cardiovascular stress test     ETT/Lexiscan Myoview 8/14:  Low risk; small mild basal to mid inferior defect-probably represents diaphragmatic attenuation with normal wall motion, no ischemia, EF 68%  . Asthma    Past Surgical History  Procedure Laterality Date  . Forearm surgery    . Hip surgery  2006    right hip  . Lung lobectomy  11/24/06  . 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   . Hip closed reduction Right 01/02/2014    Procedure: CLOSED REDUCTION HIP;  Surgeon: Mauri Pole, MD;  Location: [facility];  Service: Orthopedics;  Laterality: Right;   Family History  Problem Relation Age of Onset  . Rectal cancer Father   . Cancer Father     brain tumos  . Cancer Mother     breast   History  Substance Use Topics  . Smoking status: Former Smoker -- 0.50 packs/day for 10 years    Types:  Cigarettes    Quit date: 12/06/2009  . Smokeless tobacco: Never Used  . Alcohol Use: No    Review of Systems  All other systems reviewed and are negative.   Allergies  Codeine; Concerta; Lisinopril; Prozac; and Sertraline  Home Medications   Prior to Admission medications   Medication Sig Start Date End Date Taking? Authorizing Provider  amLODipine (NORVASC) 10 MG tablet Take 1 tablet (10 mg total) by mouth daily. 02/15/14  Yes Hali Marry, MD  ANDROGEL PUMP 20.25 MG/ACT (1.62%) GEL APPLY 3 PUMPS TO SKIN DAILY 11/06/13  Yes Hali Marry, MD  aspirin 81 MG tablet Take 81 mg by mouth daily.     Yes Historical Provider, MD  buPROPion (WELLBUTRIN XL) 150 MG 24 hr tablet Take 1 tablet (150 mg total) by mouth daily. 02/15/14  Yes Hali Marry, MD  clonazePAM (KLONOPIN) 0.5 MG tablet Take 1 tablet (0.5 mg total) by mouth 2 (two) times daily as needed for anxiety. 02/15/14  Yes Hali Marry, MD  ibuprofen (ADVIL,MOTRIN) 200 MG tablet Take 600 mg by mouth every 6 (six) hours as needed for moderate pain.    Yes Historical Provider, MD  Ledipasvir-Sofosbuvir (HARVONI PO) Take by mouth.   Yes Historical Provider, MD  metoprolol succinate (TOPROL-XL) 25 MG 24 hr  tablet Take 1 tablet (25 mg total) by mouth at bedtime. 02/15/14  Yes Hali Marry, MD  pantoprazole (PROTONIX) 40 MG tablet TAKE 1 TABLET BY MOUTH ONCE DAILY 02/15/14  Yes Hali Marry, MD  tamsulosin (FLOMAX) 0.4 MG CAPS capsule Take 1 capsule (0.4 mg total) by mouth daily after supper. 02/15/14  Yes Hali Marry, MD   BP 124/78  Pulse 68  Temp(Src) 97.6 F (36.4 C) (Oral)  Ht 5\' 11"  (1.803 m)  Wt 217 lb (98.431 kg)  BMI 30.28 kg/m2  SpO2 97% Physical Exam  Constitutional: He is oriented to person, place, and time. He appears well-developed and well-nourished.  HENT:  Head: Normocephalic and atraumatic.  Right Ear: External ear normal.  Left Ear: External ear normal.  +nasal  erythema, rhinorrhea bilaterally, + post oropharyngeal erythema    Eyes: Conjunctivae are normal. Pupils are equal, round, and reactive to light.  Neck: Normal range of motion. Neck supple.  Cardiovascular: Normal rate and regular rhythm.   Pulmonary/Chest: Effort normal and breath sounds normal.  Abdominal: Soft. Bowel sounds are normal.  Musculoskeletal: Normal range of motion.  Neurological: He is alert and oriented to person, place, and time.  Skin: Skin is warm.    ED Course  Procedures (including critical care time) Labs Review Labs Reviewed - No data to display  Imaging Review No results found.   MDM   1. URI (upper respiratory infection)    Likely viral/allergic process.  Exam reassuring. No resp distress, wheezing, hypoxia.  Depomedrol 80mg  IM x1 Discussed supportive care and infectious/resp red flags  Follow up as needed.     The patient and/or caregiver has been counseled thoroughly with regard to treatment plan and/or medications prescribed including dosage, schedule, interactions, rationale for use, and possible side effects and they verbalize understanding. Diagnoses and expected course of recovery discussed and will return if not improved as expected or if the condition worsens. Patient and/or caregiver verbalized understanding.         Shanda Howells, MD 04/20/14 [phone]

## 2014-05-02 ENCOUNTER — Other Ambulatory Visit: Payer: Self-pay | Admitting: Family Medicine

## 2014-05-02 ENCOUNTER — Telehealth: Payer: Self-pay | Admitting: *Deleted

## 2014-05-02 DIAGNOSIS — E291 Testicular hypofunction: Secondary | ICD-10-CM

## 2014-05-02 NOTE — Telephone Encounter (Signed)
Insurance requiring PA for testosterone. Pt's last labs were 01/2013. Called pt and Lm about going to lab.  Oscar La, LPN

## 2014-05-03 ENCOUNTER — Other Ambulatory Visit: Payer: Self-pay | Admitting: Family Medicine

## 2014-05-03 ENCOUNTER — Telehealth: Payer: Self-pay | Admitting: *Deleted

## 2014-05-03 NOTE — Telephone Encounter (Signed)
Pt went today to have psa labs drawn and would like for his rx to be sent to the pharmacy. I informed him that his results are not back and that I don't think that his rx can be sent until that time.Ronnie Martin

## 2014-05-06 ENCOUNTER — Other Ambulatory Visit: Payer: Self-pay | Admitting: Family Medicine

## 2014-05-06 LAB — TESTOSTERONE, FREE, TOTAL, SHBG
Sex Hormone Binding: 32 nmol/L (ref 13–71)
TESTOSTERONE FREE: 113 pg/mL (ref 47.0–244.0)
TESTOSTERONE-% FREE: 2.2 % (ref 1.6–2.9)
Testosterone: 525 ng/dL (ref 300–890)

## 2014-05-07 ENCOUNTER — Other Ambulatory Visit: Payer: Self-pay | Admitting: *Deleted

## 2014-05-07 ENCOUNTER — Ambulatory Visit (INDEPENDENT_AMBULATORY_CARE_PROVIDER_SITE_OTHER): Payer: 59 | Admitting: Sports Medicine

## 2014-05-07 ENCOUNTER — Encounter: Payer: Self-pay | Admitting: Sports Medicine

## 2014-05-07 VITALS — BP 126/81 | HR 60 | Ht 71.0 in | Wt 210.0 lb

## 2014-05-07 DIAGNOSIS — M7542 Impingement syndrome of left shoulder: Secondary | ICD-10-CM

## 2014-05-07 DIAGNOSIS — M7580 Other shoulder lesions, unspecified shoulder: Secondary | ICD-10-CM

## 2014-05-07 DIAGNOSIS — M25819 Other specified joint disorders, unspecified shoulder: Secondary | ICD-10-CM

## 2014-05-07 MED ORDER — MELOXICAM 15 MG PO TABS: ORAL_TABLET | ORAL | Status: DC

## 2014-05-07 MED ORDER — TAMSULOSIN HCL 0.4 MG PO CAPS: 0.4000 mg | ORAL_CAPSULE | Freq: Every day | ORAL | Status: DC

## 2014-05-07 MED ORDER — AMLODIPINE BESYLATE 10 MG PO TABS: 10.0000 mg | ORAL_TABLET | Freq: Every day | ORAL | Status: DC

## 2014-05-07 MED ORDER — PANTOPRAZOLE SODIUM 40 MG PO TBEC: DELAYED_RELEASE_TABLET | ORAL | Status: DC

## 2014-05-07 MED ORDER — BUPROPION HCL ER (XL) 150 MG PO TB24
150.0000 mg | ORAL_TABLET | Freq: Every day | ORAL | Status: DC
Start: 2014-05-07 — End: 2014-06-18

## 2014-05-07 NOTE — Assessment & Plan Note (Signed)
Injection as above. Single session of physical therapy to learn exercises, Mobic. Return to see me in a month, no overhead exercises in the gym for now.

## 2014-05-07 NOTE — Progress Notes (Signed)
   Subjective:    I'm seeing this patient as a consultation for:  Dr. Madilyn Fireman  CC: Left shoulder pain  HPI: This is a very pleasant 58 year old male, he is a Product manager. He comes in with a several week history of pain that he localizes over the deltoid, worse with overhead activities and abduction. Denies any trauma, or change in his workout regimen. Pain is moderate, persistent, it does wake him from sleep. He tried NSAIDs without any improvement.  Past medical history, Surgical history, Family history not pertinant except as noted below, Social history, Allergies, and medications have been entered into the medical record, reviewed, and no changes needed.   Review of Systems: No headache, visual changes, nausea, vomiting, diarrhea, constipation, dizziness, abdominal pain, skin rash, fevers, chills, night sweats, weight loss, swollen lymph nodes, body aches, joint swelling, muscle aches, chest pain, shortness of breath, mood changes, visual or auditory hallucinations.   Objective:   General: Well Developed, well nourished, and in no acute distress.  Neuro/Psych: Alert and oriented x3, extra-ocular muscles intact, able to move all 4 extremities, sensation grossly intact. Skin: Warm and dry, no rashes noted.  Respiratory: Not using accessory muscles, speaking in full sentences, trachea midline.  Cardiovascular: Pulses palpable, no extremity edema. Abdomen: Does not appear distended. Left Shoulder: Inspection reveals no abnormalities, atrophy or asymmetry. Mild tenderness to palpation over the a.c. joint and at the joint line. ROM is full in all planes. Rotator cuff strength normal throughout. Positive Neer and Hawkin's tests, empty can sign. Speeds and Yergason's tests normal. No labral pathology noted with negative Obrien's, negative clunk and good stability. Normal scapular function observed. No painful arc and no drop arm sign. No apprehension sign  Procedure: Real-time  Ultrasound Guided Injection of left subacromial bursa Device: GE Logiq E  Verbal informed consent obtained.  Time-out conducted.  Noted no overlying erythema, induration, or other signs of local infection.  Skin prepped in a sterile fashion.  Local anesthesia: Topical Ethyl chloride.  With sterile technique and under real time ultrasound guidance:  1 cc Kenalog 40, 3 cc lidocaine injected easily into the subacromial bursa Completed without difficulty  Pain immediately resolved suggesting accurate placement of the medication.  Advised to call if fevers/chills, erythema, induration, drainage, or persistent bleeding.  Images permanently stored and available for review in the ultrasound unit.  Impression: Technically successful ultrasound guided injection.  Impression and Recommendations:   This case required medical decision making of moderate complexity.

## 2014-05-08 ENCOUNTER — Encounter: Payer: 59 | Admitting: Sports Medicine

## 2014-05-10 ENCOUNTER — Ambulatory Visit: Payer: 59 | Admitting: Family Medicine

## 2014-05-10 ENCOUNTER — Other Ambulatory Visit: Payer: Self-pay | Admitting: Nurse Practitioner

## 2014-05-10 DIAGNOSIS — B182 Chronic viral hepatitis C: Secondary | ICD-10-CM

## 2014-05-14 ENCOUNTER — Ambulatory Visit: Payer: 59 | Admitting: Physical Therapy

## 2014-05-14 ENCOUNTER — Ambulatory Visit (INDEPENDENT_AMBULATORY_CARE_PROVIDER_SITE_OTHER): Payer: 59

## 2014-05-14 DIAGNOSIS — B182 Chronic viral hepatitis C: Secondary | ICD-10-CM

## 2014-05-14 DIAGNOSIS — K8020 Calculus of gallbladder without cholecystitis without obstruction: Secondary | ICD-10-CM

## 2014-06-03 ENCOUNTER — Telehealth: Payer: Self-pay | Admitting: *Deleted

## 2014-06-03 NOTE — Telephone Encounter (Signed)
Pt called to inquire about his CXR. Pt stated that since the URI he has been having a lot of phlegm. Pt advised that Dr. Madilyn Fireman would like for him to have a Spirometry done. appt made for 7.14.Ronnie Martin

## 2014-06-18 ENCOUNTER — Encounter: Payer: Self-pay | Admitting: Family Medicine

## 2014-06-18 ENCOUNTER — Ambulatory Visit (INDEPENDENT_AMBULATORY_CARE_PROVIDER_SITE_OTHER): Payer: 59 | Admitting: Family Medicine

## 2014-06-18 VITALS — BP 111/65 | HR 69 | Ht 70.6 in | Wt 214.0 lb

## 2014-06-18 DIAGNOSIS — R0602 Shortness of breath: Secondary | ICD-10-CM

## 2014-06-18 DIAGNOSIS — R0600 Dyspnea, unspecified: Secondary | ICD-10-CM

## 2014-06-18 DIAGNOSIS — R0989 Other specified symptoms and signs involving the circulatory and respiratory systems: Secondary | ICD-10-CM

## 2014-06-18 DIAGNOSIS — R0609 Other forms of dyspnea: Secondary | ICD-10-CM

## 2014-06-18 DIAGNOSIS — Z8709 Personal history of other diseases of the respiratory system: Secondary | ICD-10-CM | POA: Insufficient documentation

## 2014-06-18 DIAGNOSIS — E785 Hyperlipidemia, unspecified: Secondary | ICD-10-CM

## 2014-06-18 MED ORDER — BUPROPION HCL ER (XL) 300 MG PO TB24: 300.0000 mg | ORAL_TABLET | Freq: Every day | ORAL | Status: DC

## 2014-06-18 MED ORDER — ALBUTEROL SULFATE HFA 108 (90 BASE) MCG/ACT IN AERS: INHALATION_SPRAY | RESPIRATORY_TRACT | Status: DC

## 2014-06-18 MED ORDER — ALBUTEROL SULFATE (2.5 MG/3ML) 0.083% IN NEBU
2.5000 mg | INHALATION_SOLUTION | Freq: Once | RESPIRATORY_TRACT | Status: AC
  Administered 2014-06-18: 2.5 mg via RESPIRATORY_TRACT

## 2014-06-18 MED ORDER — ALBUTEROL SULFATE HFA 108 (90 BASE) MCG/ACT IN AERS: INHALATION_SPRAY | RESPIRATORY_TRACT | Status: AC

## 2014-06-18 MED ORDER — CLONAZEPAM 0.5 MG PO TABS: 0.5000 mg | ORAL_TABLET | Freq: Two times a day (BID) | ORAL | Status: DC | PRN

## 2014-06-18 MED ORDER — PRAVASTATIN SODIUM 40 MG PO TABS: 40.0000 mg | ORAL_TABLET | Freq: Every day | ORAL | Status: DC

## 2014-06-18 NOTE — Progress Notes (Signed)
   Subjective:    Patient ID: Ronnie Martin, male    DOB: [birth year], 58 y.o.   MRN: [id number]  HPI  Former smoker who quit in 2011 is here for spirometry today. History of lung lobectomy in 2007 for lung carcinoma. Last chest x-ray was done in December and was fairly normal. He has had chronic dyspnea. We're evaluating for COPD today. Does have a cough every morning.  Plegm in chest in the AM.  Has been SOB going up hill and up stairs some of the time. Says doesn't limit his activities.  Has been walking a mile a day.  Sleeping well overall, just gets up to urinate occ.Marland Kitchen  Feels energetic.    Hyperlipidemia- reviewed cholesterol results.    Review of Systems     Objective:   Physical Exam  Constitutional: He is oriented to person, place, and time. He appears well-developed and well-nourished.  HENT:  Head: Normocephalic and atraumatic.  Cardiovascular: Normal rate, regular rhythm and normal heart sounds.   Pulmonary/Chest: Effort normal and breath sounds normal.  Neurological: He is alert and oriented to person, place, and time.  Skin: Skin is warm and dry.  Psychiatric: He has a normal mood and affect. His behavior is normal.          Assessment & Plan:  Dyspnea - Spirometry - essentially normal as ratio is 74%. FEV1 is 78%.  He would probably technically be classified as stage 0 COPD. Explained to him that we really is on the borderline for diagnosis. With his prior history I suspect that he really has early stage COPD especially with chronic bronchitis type symptoms in the mornings. Even that he is no longer smoking which is fantastic. He did have some mild response to albuterol but not enough to meet criteria for asthma. Interestingly he did have asthma as a child and says he outgrew it around age 65. I want to make sure that he has an albuterol inhaler on hand when he gets sick or if he starts to feel short of breath. I would like to repeat his spirometry in one  year.   Hyperlipidemia-based on his cholesterol results from March his ten-year risk calculated score for ASCVD is 14%. This based on new guidelines recommendation would be for a moderate high-intensity statin. Discussed this with him today. 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 He is completely off of treatment for this but does follow every 3 months with the hepatology clinic.

## 2014-06-18 NOTE — Patient Instructions (Signed)
Can get labs done with hepatologist next month.   We'll repeat her spirometry in one year.

## 2014-07-16 IMAGING — CR DG CHEST 2V
2 series · 2 of 2 positions shown · non-contrast
Comparison: Chest x-ray of 02/07/2012 and CT chest of 08/17/2011

CLINICAL DATA: Severe shortness of breath, former smoking history,
history of lung carcinoma with surgery 5 years ago

EXAM:
CHEST  2 VIEW

[view not recorded (1 of 2)]
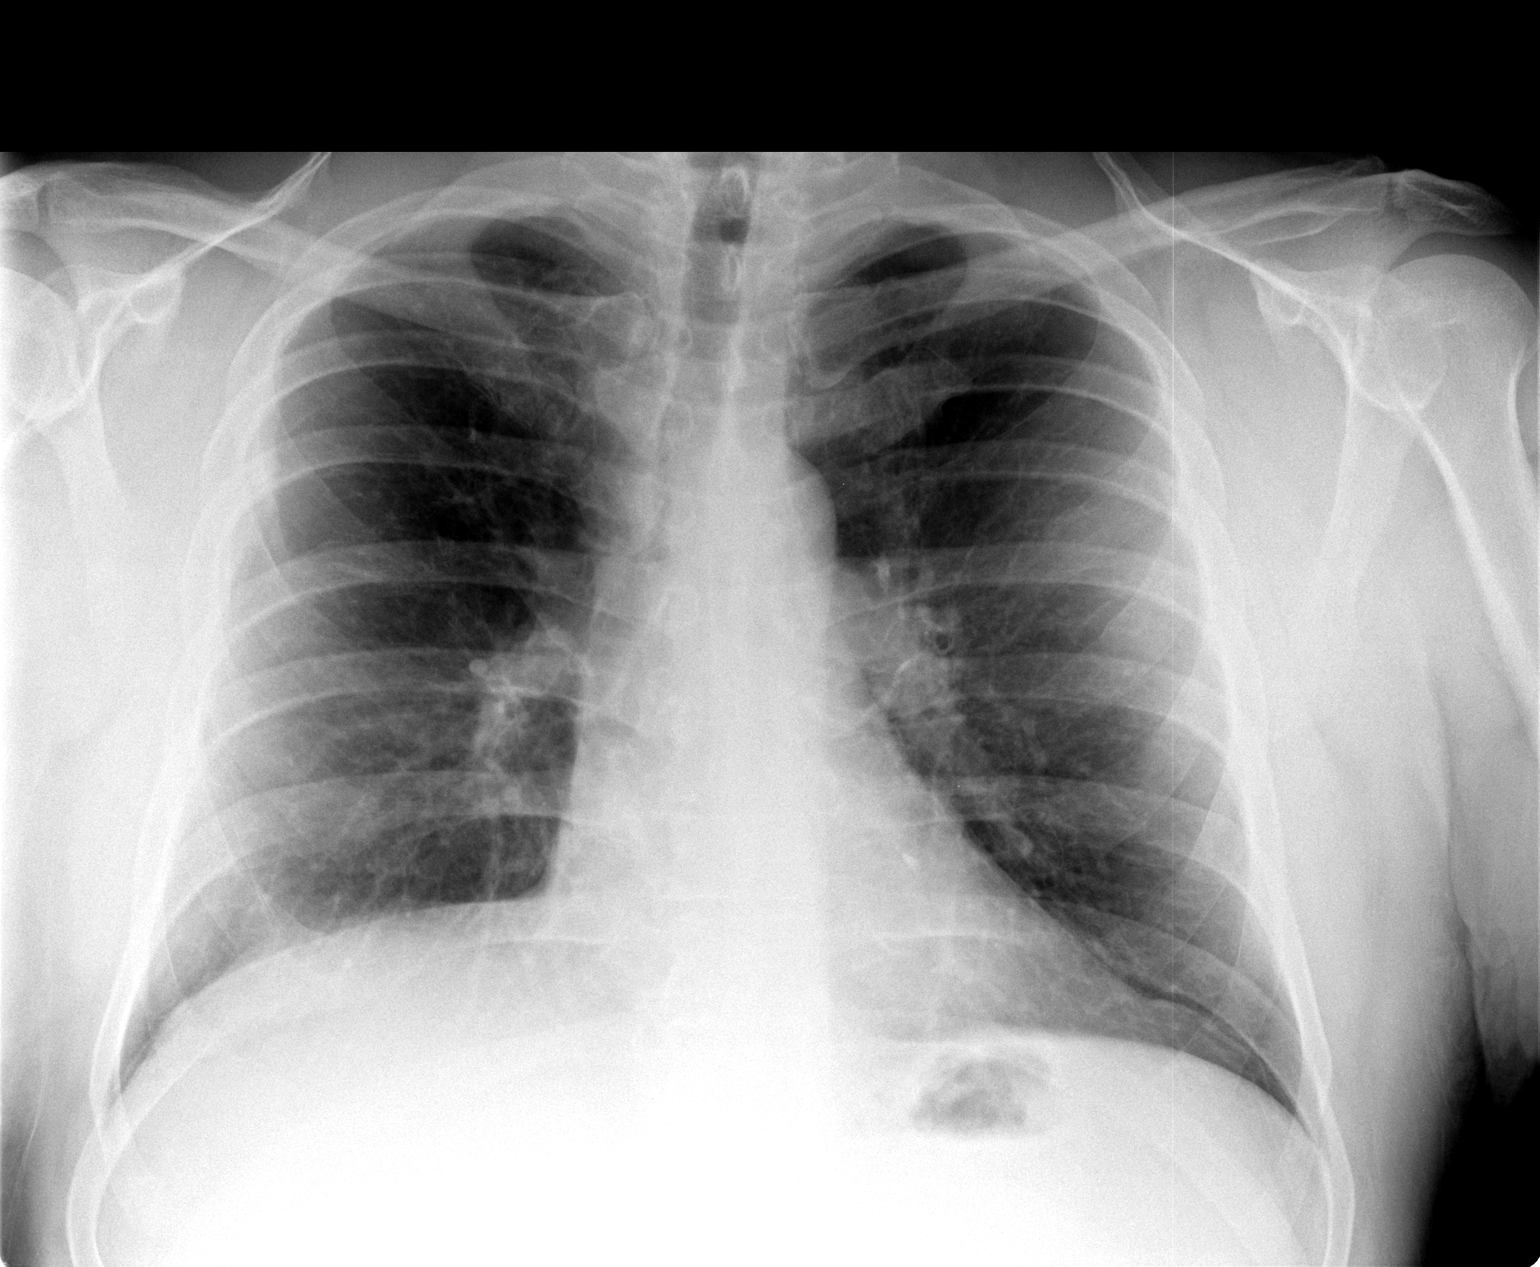

[view not recorded (2 of 2)]
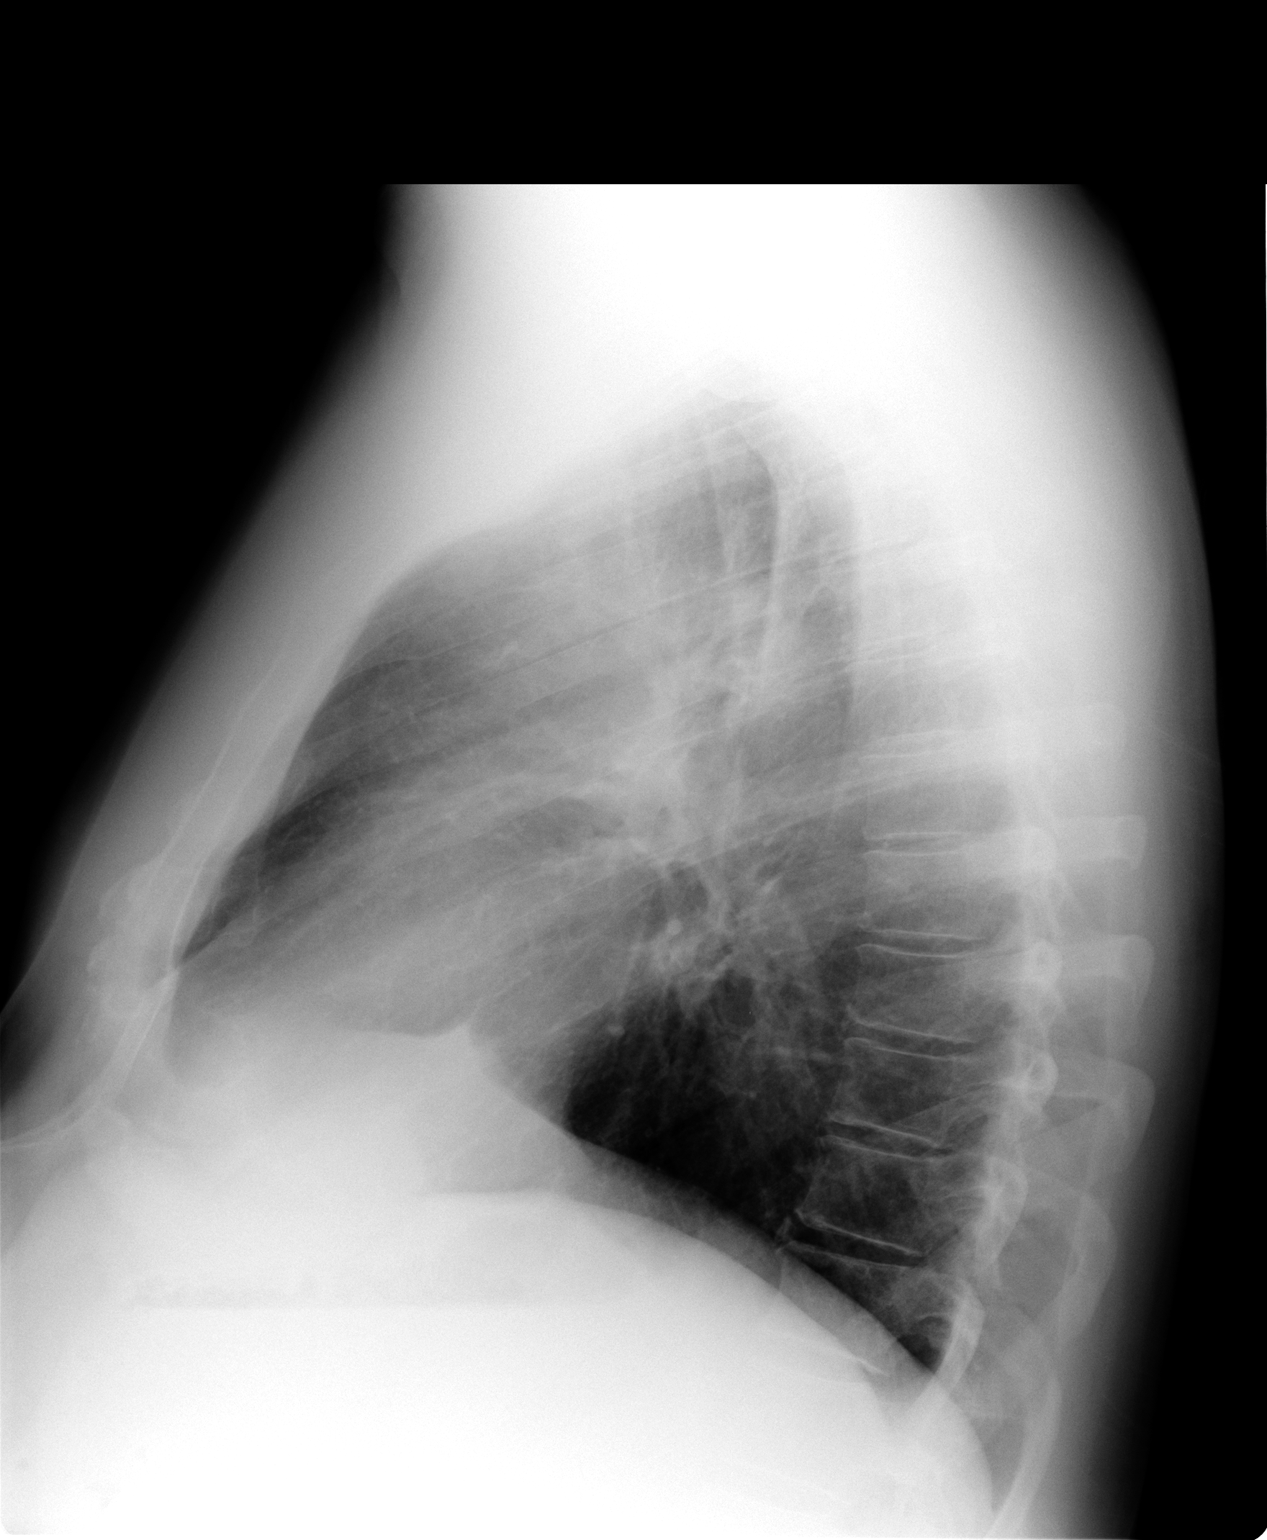

[2 of 2 positions shown; findings below may reference images not displayed]

FINDINGS: Mild scarring at the right lung base is stable postoperatively. No
infiltrate or effusion is seen. There is no evidence of recurrence
of lung carcinoma. No mediastinal or hilar adenopathy is noted. The
heart is within normal limits in size. No bony abnormality is seen.
IMPRESSION: Stable chest x-ray.  No active lung disease.

## 2014-08-07 ENCOUNTER — Telehealth: Payer: Self-pay | Admitting: *Deleted

## 2014-08-07 NOTE — Telephone Encounter (Signed)
Pt given results of Hep C labs.Ronnie Martin

## 2014-09-11 ENCOUNTER — Other Ambulatory Visit: Payer: Self-pay | Admitting: Family Medicine

## 2014-09-11 MED ORDER — OMEPRAZOLE 40 MG PO CPDR: 40.0000 mg | DELAYED_RELEASE_CAPSULE | Freq: Every day | ORAL | Status: DC

## 2014-09-13 ENCOUNTER — Telehealth: Payer: Self-pay | Admitting: Family Medicine

## 2014-09-13 NOTE — Telephone Encounter (Signed)
Ronnie Martin called. He has an appt on 10/14 for cholesterol chk and wants lab work sent down by Monday.  Thank you

## 2014-09-16 ENCOUNTER — Other Ambulatory Visit: Payer: Self-pay | Admitting: *Deleted

## 2014-09-16 DIAGNOSIS — E785 Hyperlipidemia, unspecified: Secondary | ICD-10-CM

## 2014-09-16 LAB — LIPID PANEL
Cholesterol: 136 mg/dL (ref 0–200)
HDL: 38 mg/dL — ABNORMAL LOW (ref >39)
LDL Cholesterol: 74 mg/dL (ref 0–99)
TRIGLYCERIDES: 122 mg/dL (ref <150)
Total CHOL/HDL Ratio: 3.6 Ratio
VLDL: 24 mg/dL (ref 0–40)

## 2014-09-16 NOTE — Telephone Encounter (Signed)
Order given.Ronnie Martin

## 2014-09-18 ENCOUNTER — Encounter: Payer: Self-pay | Admitting: Family Medicine

## 2014-09-18 ENCOUNTER — Ambulatory Visit (INDEPENDENT_AMBULATORY_CARE_PROVIDER_SITE_OTHER): Payer: 59 | Admitting: Family Medicine

## 2014-09-18 VITALS — BP 126/82 | HR 62 | Ht 71.0 in | Wt 213.0 lb

## 2014-09-18 DIAGNOSIS — N401 Enlarged prostate with lower urinary tract symptoms: Secondary | ICD-10-CM

## 2014-09-18 DIAGNOSIS — I10 Essential (primary) hypertension: Secondary | ICD-10-CM

## 2014-09-18 DIAGNOSIS — F411 Generalized anxiety disorder: Secondary | ICD-10-CM

## 2014-09-18 DIAGNOSIS — R3914 Feeling of incomplete bladder emptying: Secondary | ICD-10-CM

## 2014-09-18 MED ORDER — TAMSULOSIN HCL 0.4 MG PO CAPS: 0.4000 mg | ORAL_CAPSULE | Freq: Every day | ORAL | Status: DC

## 2014-09-18 MED ORDER — FINASTERIDE 5 MG PO TABS
5.0000 mg | ORAL_TABLET | Freq: Every day | ORAL | Status: DC
Start: 2014-09-18 — End: 2014-11-19

## 2014-09-18 MED ORDER — BUPROPION HCL ER (XL) 450 MG PO TB24: 450.0000 mg | ORAL_TABLET | Freq: Every day | ORAL | Status: DC

## 2014-09-18 MED ORDER — CLONAZEPAM 1 MG PO TABS: 1.0000 mg | ORAL_TABLET | Freq: Two times a day (BID) | ORAL | Status: DC | PRN

## 2014-09-18 NOTE — Progress Notes (Signed)
   Subjective:    Patient ID: Ronnie Martin, male    DOB: [birth year], 58 y.o.   MRN: [id number]  Benign Prostatic Hypertrophy   BPH-he feels like he still not come clearly empty his bladder. He's having some hesitancy. Some starting and stopping with urination. He currently is on Flomax 0.4 mg daily. Wants to know if the dose could be increased.  Generalized anxiety disorder-he is using his rescue medicine about 6-8 times per week. Half of that is while at work because of increased irritability and anxiety. The other half is to help him sleep. He feels like a 0.5 mg is not working as well and typically has take 2 tabs to get and affect.  Hypertension-blood pressures have been a great. He says when he checks them at different times during the day they have been well-controlled and well maintained on his current regimen. Review of Systems     Objective:   Physical Exam  Constitutional: He is oriented to person, place, and time. He appears well-developed and well-nourished.  HENT:  Head: Normocephalic and atraumatic.  Cardiovascular: Normal rate, regular rhythm and normal heart sounds.   Pulmonary/Chest: Effort normal and breath sounds normal.  Neurological: He is alert and oriented to person, place, and time.  Skin: Skin is warm and dry.  Psychiatric: He has a normal mood and affect. His behavior is normal.          Assessment & Plan:  BPH-not well controlled currently on Flomax. Will add finasteride to his regimen. Followup in 6 weeks to make sure working well and improving his symptoms.  Generalized anxiety disorder-we discussed that he's using his rescue medication that frequently we really need to work on a controller. For one month I would like to try to increase his Wellbutrin to 450 mg. If this is not successful in reducing his benzodiazepine use then I would like to consider adding an SSRI to his Wellbutrin and possibly drop it back down to 300 mg. I did increase his  clonazepam to 1 mg. Reminded him to use it sparingly.   hypertension-well-controlled on current regimen. Looks fantastic.

## 2014-10-01 ENCOUNTER — Telehealth: Payer: Self-pay | Admitting: *Deleted

## 2014-10-01 NOTE — Telephone Encounter (Signed)
Pt called and stated that he read the side effects of his meds finasteride possibly causing cancer,wellbutrin is causing SOB and he is concerned with the effects of this with his liver.  He stated that he will read the warnings again and highlight them and call back with findings.Audelia Hives [place]

## 2014-10-08 ENCOUNTER — Telehealth: Payer: Self-pay | Admitting: *Deleted

## 2014-10-08 NOTE — Telephone Encounter (Signed)
Pt called and stated that he has been having tremors in his L hand and took klonopin which helped. He questions whether or not the recent changes in his meds could have attributed to this. I advised that he come in to discuss. appt made.Ronnie Martin

## 2014-10-15 ENCOUNTER — Encounter: Payer: Self-pay | Admitting: Family Medicine

## 2014-10-15 ENCOUNTER — Ambulatory Visit (INDEPENDENT_AMBULATORY_CARE_PROVIDER_SITE_OTHER): Payer: 59 | Admitting: Family Medicine

## 2014-10-15 VITALS — BP 123/76 | HR 67 | Temp 97.8°F | Ht 71.0 in | Wt 213.0 lb

## 2014-10-15 DIAGNOSIS — K219 Gastro-esophageal reflux disease without esophagitis: Secondary | ICD-10-CM | POA: Insufficient documentation

## 2014-10-15 DIAGNOSIS — E291 Testicular hypofunction: Secondary | ICD-10-CM

## 2014-10-15 DIAGNOSIS — F411 Generalized anxiety disorder: Secondary | ICD-10-CM

## 2014-10-15 MED ORDER — BUPROPION HCL ER (XL) 150 MG PO TB24: 150.0000 mg | ORAL_TABLET | ORAL | Status: DC

## 2014-10-15 NOTE — Patient Instructions (Addendum)
Decrease pravastatin to every other for one month and then decrease to every 2 days and taper off.

## 2014-10-15 NOTE — Progress Notes (Signed)
   Subjective:    Patient ID: Ronnie Martin, male    DOB: 10/20/1956, 58 y.o.   MRN: [id number]  HPI He has some concerns about the wellbutrin.  Says has noticed some change in memory.  Feels more agitated.  Has had more of a tremor and constipation.  Takes  Fiber every day.  Also switched to decaf lately.  Has been more constipate. He is now on wellbutrin 300mg .  Feels like his sleep is worse. Says now only getting 6 hours instead of 8 hours.   Hypogonadism-he wanted to discuss testosterone replacement therapy. He had some questions about this.  BPH -He also had some concerns about the finasteride and had some questions about this as well.  GERD-he feels like his reflux is well-controlled with the PPI but does want to start weaning some medications if at all possible. He still typically eats right before bedtime because he says it helps him fall asleep better. Has switched to decaf coffee. Review of Systems     Objective:   Physical Exam  Constitutional: He is oriented to person, place, and time. He appears well-developed and well-nourished.  HENT:  Head: Normocephalic and atraumatic.  Neurological: He is alert and oriented to person, place, and time.  Skin: Skin is warm and dry.  Psychiatric: He has a normal mood and affect. His behavior is normal.          Assessment & Plan:  Generalized anxiety-he would really like to wean down the medication and see if some of the side effects improved but still helps control his symptoms. He is moving soon.Decrease wellbutrin to 150 mg QD. If he is still in the local area then recommend follow-up in 3 months. If his symptoms do not improve then he may need to try a different medication completely. Next  Hypogonadism-discussed the increased risk of heart disease and cancer with testosterone therapy that is being shown in some retrospective studies that have come out recently. There are no prospective studies. At this time he would like to  continue therapy. He has actually ever due to recheck his PSA. We'll recheck today.  GERD-Decrease PPI and wean off. Avoid eating right before bedtime.   BPH - we discussed that both meds work in different ways to help with his prostate sxs. Certainly if he would like to go down on his dose we can do so, etc.    I did remove move it from his medication list. He no longer uses it. He just uses IBU on occasionally but not on a frequent basis.

## 2014-10-16 LAB — PSA: PSA: 0.57 ng/mL (ref <=4.00)

## 2014-10-16 NOTE — Progress Notes (Signed)
Quick Note:  All labs are normal. ______ 

## 2014-10-30 ENCOUNTER — Ambulatory Visit: Payer: 59 | Admitting: Family Medicine

## 2014-11-07 ENCOUNTER — Emergency Department
Admission: EM | Admit: 2014-11-07 | Discharge: 2014-11-07 | Disposition: A | Discharge status: Home / Self Care | Payer: 59 | Source: Home / Self Care

## 2014-11-07 ENCOUNTER — Encounter: Payer: Self-pay | Admitting: Emergency Medicine

## 2014-11-07 DIAGNOSIS — J029 Acute pharyngitis, unspecified: Secondary | ICD-10-CM

## 2014-11-07 MED ORDER — IPRATROPIUM BROMIDE 0.06 % NA SOLN: 2.0000 | Freq: Four times a day (QID) | NASAL | Status: DC

## 2014-11-07 MED ORDER — PREDNISONE 10 MG PO TABS
30.0000 mg | ORAL_TABLET | Freq: Every day | ORAL | Status: DC
Start: 2014-11-07 — End: 2014-11-13

## 2014-11-07 NOTE — ED Notes (Signed)
Reports sore throat, sneezing and congestion x 2 days; no fever; no OTCs.

## 2014-11-07 NOTE — ED Provider Notes (Signed)
Ronnie Martin is a 58 y.o. male who presents to Urgent Care today for sore throat congestion and sneezing. Symptoms present for 2 days. Patient also notes a postnasal drip. Not medicines tried yet. Patient is concerned that he has strep throat. No vomiting or diarrhea.   Past Medical History  Diagnosis Date  . Hypertension   . Carcinoma of lung   . Hepatitis C   . GERD (gastroesophageal reflux disease)   . Diverticulosis of colon   . Colon polyps     history of polyps  . Hx of cardiovascular stress test     ETT/Lexiscan Myoview 8/14:  Low risk; small mild basal to mid inferior defect-probably represents diaphragmatic attenuation with normal wall motion, no ischemia, EF 68%  . Asthma    Past Surgical History  Procedure Laterality Date  . Forearm surgery    . Hip surgery  2006    right hip  . Lung lobectomy  11/24/06  . 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   . Hip closed reduction Right 01/02/2014    Procedure: CLOSED REDUCTION HIP;  Surgeon: Mauri Pole, MD;  Location: [facility];  Service: Orthopedics;  Laterality: Right;   History  Substance Use Topics  . Smoking status: Former Smoker -- 0.50 packs/day for 10 years    Types: Cigarettes    Quit date: 12/06/2009  . Smokeless tobacco: Never Used  . Alcohol Use: No   ROS as above Medications: No current facility-administered medications for this encounter.   Current Outpatient Prescriptions  Medication Sig Dispense Refill  . albuterol (VENTOLIN HFA) 108 (90 BASE) MCG/ACT inhaler INHALE 2 PUFFS INTO THE LUNGS EVERY 6 HOURS AS NEEDED FOR WHEEZING. 18 each 2  . amLODipine (NORVASC) 10 MG tablet Take 1 tablet (10 mg total) by mouth daily. 90 tablet 2  . ANDROGEL PUMP 20.25 MG/ACT (1.62%) GEL APPLY 3 PUMPS TO SKIN DAILY 300 g 3  . aspirin 81 MG tablet Take 81 mg by mouth daily.      Marland Kitchen buPROPion (WELLBUTRIN XL) 150 MG 24 hr tablet Take 1 tablet (150 mg total) by mouth every morning. 30 tablet 1  . clonazePAM  (KLONOPIN) 1 MG tablet Take 1 tablet (1 mg total) by mouth 2 (two) times daily as needed for anxiety. 90 tablet 0  . finasteride (PROSCAR) 5 MG tablet Take 1 tablet (5 mg total) by mouth daily. 30 tablet 2  . ibuprofen (ADVIL,MOTRIN) 200 MG tablet Take 600 mg by mouth every 6 (six) hours as needed for moderate pain.     Marland Kitchen ipratropium (ATROVENT) 0.06 % nasal spray Place 2 sprays into both nostrils 4 (four) times daily. 15 mL 1  . metoprolol succinate (TOPROL-XL) 25 MG 24 hr tablet Take 1 tablet (25 mg total) by mouth at bedtime. 90 tablet 1  . omeprazole (PRILOSEC) 40 MG capsule Take 1 capsule (40 mg total) by mouth daily. 90 capsule 1  . pravastatin (PRAVACHOL) 40 MG tablet Take 1 tablet (40 mg total) by mouth daily. 90 tablet 3  . predniSONE (DELTASONE) 10 MG tablet Take 3 tablets (30 mg total) by mouth daily. 15 tablet 0  . tamsulosin (FLOMAX) 0.4 MG CAPS capsule Take 1 capsule (0.4 mg total) by mouth daily after supper. 90 capsule 2   Allergies  Allergen Reactions  . Codeine     REACTION: nausea  . Concerta [Methylphenidate] Other (See Comments)    Elevated BP  . Lisinopril Cough  .  Prozac [Fluoxetine Hcl]     yawning  . Sertraline Nausea Only     Exam:  BP 128/78 mmHg  Pulse 63  Temp(Src) 97.6 F (36.4 C) (Oral)  Resp 16  Ht 5' 10.5" (1.791 m)  Wt 210 lb (95.255 kg)  BMI 29.70 kg/m2  SpO2 97% Gen: Well NAD HEENT: EOMI,  MMM posterior pharynx is mildly erythematous. Normal tympanic membranes bilaterally. No significant nasal discharge. Lungs: Normal work of breathing. CTABL Heart: RRR no MRG Abd: NABS, Soft. Nondistended, Nontender Exts: Brisk capillary refill, warm and well perfused.   Rapid strep test: Negative  No results found for this or any previous visit (from the past 24 hour(s)). No results found.  Assessment and Plan: 58 y.o. male with viral pharyngitis. Throat culture pending. Treatment with prednisone and Atrovent nasal spray and Tylenol. Follow-up with  PCP as needed.  Discussed warning signs or symptoms. Please see discharge instructions. Patient expresses understanding.     Gregor Hams, MD 11/07/14 [phone]

## 2014-11-07 NOTE — Discharge Instructions (Signed)
Thank you for coming in today. Take Tylenol for pain. Take prednisone daily for 5 days. Use Atrovent nasal spray. Call or go to the emergency room if you get worse, have trouble breathing, have chest pains, or palpitations.   Pharyngitis Pharyngitis is redness, pain, and swelling (inflammation) of your pharynx.  CAUSES  Pharyngitis is usually caused by infection. Most of the time, these infections are from viruses (viral) and are part of a cold. However, sometimes pharyngitis is caused by bacteria (bacterial). Pharyngitis can also be caused by allergies. Viral pharyngitis may be spread from person to person by coughing, sneezing, and personal items or utensils (cups, forks, spoons, toothbrushes). Bacterial pharyngitis may be spread from person to person by more intimate contact, such as kissing.  SIGNS AND SYMPTOMS  Symptoms of pharyngitis include:   Sore throat.   Tiredness (fatigue).   Low-grade fever.   Headache.  Joint pain and muscle aches.  Skin rashes.  Swollen lymph nodes.  Plaque-like film on throat or tonsils (often seen with bacterial pharyngitis). DIAGNOSIS  Your health care provider will ask you questions about your illness and your symptoms. Your medical history, along with a physical exam, is often all that is needed to diagnose pharyngitis. Sometimes, a rapid strep test is done. Other lab tests may also be done, depending on the suspected cause.  TREATMENT  Viral pharyngitis will usually get better in 3-4 days without the use of medicine. Bacterial pharyngitis is treated with medicines that kill germs (antibiotics).  HOME CARE INSTRUCTIONS   Drink enough water and fluids to keep your urine clear or pale yellow.   Only take over-the-counter or prescription medicines as directed by your health care provider:   If you are prescribed antibiotics, make sure you finish them even if you start to feel better.   Do not take aspirin.   Get lots of rest.    Gargle with 8 oz of salt water ( tsp of salt per 1 qt of water) as often as every 1-2 hours to soothe your throat.   Throat lozenges (if you are not at risk for choking) or sprays may be used to soothe your throat. SEEK MEDICAL CARE IF:   You have large, tender lumps in your neck.  You have a rash.  You cough up green, yellow-brown, or bloody spit. SEEK IMMEDIATE MEDICAL CARE IF:   Your neck becomes stiff.  You drool or are unable to swallow liquids.  You vomit or are unable to keep medicines or liquids down.  You have severe pain that does not go away with the use of recommended medicines.  You have trouble breathing (not caused by a stuffy nose). MAKE SURE YOU:   Understand these instructions.  Will watch your condition.  Will get help right away if you are not doing well or get worse. Document Released: 11/22/2005 Document Revised: 09/12/2013 Document Reviewed: 07/30/2013 [facility] Patient Information 2015 [place], [place]. This information is not intended to replace advice given to you by your health care provider. Make sure you discuss any questions you have with your health care provider.

## 2014-11-08 ENCOUNTER — Telehealth: Payer: Self-pay | Admitting: Emergency Medicine

## 2014-11-08 LAB — STREP A DNA PROBE: GASP: POSITIVE

## 2014-11-13 ENCOUNTER — Ambulatory Visit (INDEPENDENT_AMBULATORY_CARE_PROVIDER_SITE_OTHER): Payer: 59 | Admitting: Family Medicine

## 2014-11-13 ENCOUNTER — Encounter: Payer: Self-pay | Admitting: Family Medicine

## 2014-11-13 VITALS — BP 127/74 | HR 67 | Temp 97.7°F | Wt 220.0 lb

## 2014-11-13 DIAGNOSIS — K219 Gastro-esophageal reflux disease without esophagitis: Secondary | ICD-10-CM

## 2014-11-13 DIAGNOSIS — J020 Streptococcal pharyngitis: Secondary | ICD-10-CM

## 2014-11-13 DIAGNOSIS — F411 Generalized anxiety disorder: Secondary | ICD-10-CM

## 2014-11-13 DIAGNOSIS — E291 Testicular hypofunction: Secondary | ICD-10-CM

## 2014-11-13 DIAGNOSIS — I10 Essential (primary) hypertension: Secondary | ICD-10-CM

## 2014-11-13 MED ORDER — METOPROLOL SUCCINATE ER 50 MG PO TB24: ORAL_TABLET | ORAL | Status: AC

## 2014-11-13 MED ORDER — TAMSULOSIN HCL 0.4 MG PO CAPS: 0.4000 mg | ORAL_CAPSULE | Freq: Every day | ORAL | Status: AC

## 2014-11-13 MED ORDER — OMEPRAZOLE 40 MG PO CPDR: 40.0000 mg | DELAYED_RELEASE_CAPSULE | Freq: Every day | ORAL | Status: AC

## 2014-11-13 MED ORDER — PENICILLIN G BENZATHINE 1200000 UNIT/2ML IM SUSP
1.2000 10*6.[IU] | Freq: Once | INTRAMUSCULAR | Status: AC
  Administered 2014-11-13: 1.2 10*6.[IU] via INTRAMUSCULAR

## 2014-11-13 MED ORDER — BUPROPION HCL ER (XL) 300 MG PO TB24: 300.0000 mg | ORAL_TABLET | ORAL | Status: AC

## 2014-11-13 MED ORDER — ANDROGEL PUMP 20.25 MG/ACT (1.62%) TD GEL: TRANSDERMAL | Status: AC

## 2014-11-13 MED ORDER — AMLODIPINE BESYLATE 10 MG PO TABS: 10.0000 mg | ORAL_TABLET | Freq: Every day | ORAL | Status: AC

## 2014-11-13 MED ORDER — PRAVASTATIN SODIUM 80 MG PO TABS: 40.0000 mg | ORAL_TABLET | Freq: Every day | ORAL | Status: AC

## 2014-11-13 MED ORDER — CLONAZEPAM 1 MG PO TABS: 1.0000 mg | ORAL_TABLET | Freq: Two times a day (BID) | ORAL | Status: AC | PRN

## 2014-11-13 NOTE — Progress Notes (Signed)
Subjective:    Patient ID: Ronnie Martin, male    DOB: 03/21/1956, 58 y.o.   MRN: [id number]  HPI He is here to follow up one last time before he moves to [place].  He needs refills on most of his medications and just wants to make sure he has everything he needs before he moves to [place] and tries to find a new primary care provider.  He would like to inc dose of pravastatin and metoprolol so he can split them.   GAD -decreased dose on the wellbutrin and feels like his shakes are much better. He's happy with his current regimen. Does request refills on clonazepam.  Was seen 6 days ago and dx with strep throat based on a positive culture.  Has been on Penicillin for 4 days.  Says took off Monday bc his throat was very painful.  Says did work last night and just felt bad.  Throat is still painful he felt sore and achy. He said he been taking his medication consistently.  Hypertension- Pt denies chest pain, SOB, dizziness, or heart palpitations.  Taking meds as directed w/o problems.  Denies medication side effects.    Hypogonadism-currently on AndroGel. Last PSA was in November and was normal at 0.57. Last testosterone level was 525 in May 2015.   Patient Active Problem List   Diagnosis Date Noted  . Gastroesophageal reflux disease without esophagitis 10/15/2014  . History of asthma 06/18/2014  . Impingement syndrome of left shoulder 05/07/2014  . GAD (generalized anxiety disorder) 02/25/2014  . Dental caries 01/14/2014  . Gingivitis 01/14/2014  . Erectile dysfunction 01/14/2014  . Thrombosed external hemorrhoid 07/17/2013  . Dyspnea 05/02/2013  . Dyslexia 06/20/2012  . Overweight (BMI 25.0-29.9) 01/26/2012  . HIP REPLACEMENT, HX OF 01/19/2011  . POSTURAL HYPOTENSION 02/24/2010  . Benign prostatic hypertrophy (BPH) with incomplete bladder emptying 01/07/2010  . DECREASED LIBIDO 01/07/2010  . HEPATITIS B 03/22/2009  . HYPERLIPIDEMIA 03/22/2009  . Essential hypertension  03/22/2009  . POSITIVE TB SKIN TEST, WITHOUT TUBERCULOSIS 08/26/2008  . INSULIN RESISTANCE SYNDROME 07/17/2008  . ADHD 04/18/2008  . OSTEOARTHRITIS, HIP, LEFT 04/18/2008  . Hypogonadism in male 05/04/2007  . MALE PATTERN BALDNESS 04/05/2007  . HEPATITIS C, CHRONIC VIRAL, W/O HEPATIC COMA 01/04/2007  . COLONIC POLYPS 01/04/2007  . HX, PERSONAL, MALIGNANCY, BRONCHUS/LUNG 01/04/2007     Review of Systems  BP 127/74 mmHg  Pulse 67  Temp(Src) 97.7 F (36.5 C)  Wt 220 lb (99.791 kg)  SpO2 97%    Allergies  Allergen Reactions  . Codeine     REACTION: nausea  . Concerta [Methylphenidate] Other (See Comments)    Elevated BP  . Lisinopril Cough  . Prozac [Fluoxetine Hcl]     yawning  . Sertraline Nausea Only    Past Medical History  Diagnosis Date  . Hypertension   . Carcinoma of lung   . Hepatitis C   . GERD (gastroesophageal reflux disease)   . Diverticulosis of colon   . Colon polyps     history of polyps  . Hx of cardiovascular stress test     ETT/Lexiscan Myoview 8/14:  Low risk; small mild basal to mid inferior defect-probably represents diaphragmatic attenuation with normal wall motion, no ischemia, EF 68%  . Asthma     Past Surgical History  Procedure Laterality Date  . Forearm surgery    . Hip surgery  2006    right hip  . Lung lobectomy  11/24/06  . Total hip  revision  04/2011    Had high colbalt levels.   . Hip closed reduction Right 01/02/2014    Procedure: CLOSED REDUCTION HIP;  Surgeon: Mauri Pole, MD;  Location: [facility];  Service: Orthopedics;  Laterality: Right;    History   Social History  . Marital Status: Married    Spouse Name: N/A    Number of Children: N/A  . Years of Education: N/A   Occupational History  . Not on file.   Social History Main Topics  . Smoking status: Former Smoker -- 0.50 packs/day for 10 years    Types: Cigarettes    Quit date: 12/06/2009  . Smokeless tobacco: Never Used  . Alcohol Use: No  . Drug Use: No  .  Sexual Activity: Not on file   Other Topics Concern  . Not on file   Social History Narrative    Family History  Problem Relation Age of Onset  . Rectal cancer Father   . Cancer Father     brain tumos  . Cancer Mother     breast    Outpatient Encounter Prescriptions as of 11/13/2014  Medication Sig  . albuterol (VENTOLIN HFA) 108 (90 BASE) MCG/ACT inhaler INHALE 2 PUFFS INTO THE LUNGS EVERY 6 HOURS AS NEEDED FOR WHEEZING.  Marland Kitchen amLODipine (NORVASC) 10 MG tablet Take 1 tablet (10 mg total) by mouth daily.  . ANDROGEL PUMP 20.25 MG/ACT (1.62%) GEL APPLY 3 PUMPS TO SKIN DAILY  . aspirin 81 MG tablet Take 81 mg by mouth daily.    Marland Kitchen buPROPion (WELLBUTRIN XL) 300 MG 24 hr tablet Take 1 tablet (300 mg total) by mouth every morning.  . clonazePAM (KLONOPIN) 1 MG tablet Take 1 tablet (1 mg total) by mouth 2 (two) times daily as needed for anxiety.  . finasteride (PROSCAR) 5 MG tablet Take 1 tablet (5 mg total) by mouth daily.  Marland Kitchen ibuprofen (ADVIL,MOTRIN) 200 MG tablet Take 600 mg by mouth every 6 (six) hours as needed for moderate pain.   . metoprolol succinate (TOPROL-XL) 50 MG 24 hr tablet Take 0.5 tablets (25 mg total) by mouth daily  . omeprazole (PRILOSEC) 40 MG capsule Take 1 capsule (40 mg total) by mouth daily.  . pravastatin (PRAVACHOL) 80 MG tablet Take 0.5 tablets (40 mg total) by mouth daily.  . tamsulosin (FLOMAX) 0.4 MG CAPS capsule Take 1 capsule (0.4 mg total) by mouth daily after supper.  . [DISCONTINUED] amLODipine (NORVASC) 10 MG tablet Take 1 tablet (10 mg total) by mouth daily.  . [DISCONTINUED] ANDROGEL PUMP 20.25 MG/ACT (1.62%) GEL APPLY 3 PUMPS TO SKIN DAILY  . [DISCONTINUED] buPROPion (WELLBUTRIN XL) 150 MG 24 hr tablet Take 1 tablet (150 mg total) by mouth every morning.  . [DISCONTINUED] clonazePAM (KLONOPIN) 1 MG tablet Take 1 tablet (1 mg total) by mouth 2 (two) times daily as needed for anxiety.  . [DISCONTINUED] ipratropium (ATROVENT) 0.06 % nasal spray Place 2  sprays into both nostrils 4 (four) times daily.  . [DISCONTINUED] metoprolol succinate (TOPROL-XL) 25 MG 24 hr tablet Take 1 tablet (25 mg total) by mouth at bedtime.  . [DISCONTINUED] omeprazole (PRILOSEC) 40 MG capsule Take 1 capsule (40 mg total) by mouth daily.  . [DISCONTINUED] pravastatin (PRAVACHOL) 40 MG tablet Take 1 tablet (40 mg total) by mouth daily.  . [DISCONTINUED] tamsulosin (FLOMAX) 0.4 MG CAPS capsule Take 1 capsule (0.4 mg total) by mouth daily after supper.  . [DISCONTINUED] predniSONE (DELTASONE) 10 MG tablet Take 3 tablets (30  mg total) by mouth daily.  . [EXPIRED] penicillin g benzathine (BICILLIN LA) 1200000 UNIT/2ML injection 1.2 Million Units           Objective:   Physical Exam  Constitutional: He is oriented to person, place, and time. He appears well-developed and well-nourished.  HENT:  Head: Normocephalic and atraumatic.  Right Ear: External ear normal.  Left Ear: External ear normal.  Nose: Nose normal.  Mouth/Throat: Oropharynx is clear and moist.  TMs and canals are clear.   Eyes: Conjunctivae and EOM are normal. Pupils are equal, round, and reactive to light.  Neck: Neck supple. No thyromegaly present.  Cardiovascular: Normal rate and normal heart sounds.   Pulmonary/Chest: Effort normal and breath sounds normal.  Lymphadenopathy:    He has no cervical adenopathy.  Neurological: He is alert and oriented to person, place, and time.  Skin: Skin is warm and dry.  Psychiatric: He has a normal mood and affect.         Assessment & Plan:  Hyperlipidemia - will increase dose to 80mg  so can split the tabs so give him time to find PCP.   Lab Results  Component Value Date   CHOL 136 09/16/2014   HDL 38* 09/16/2014   LDLCALC 74 09/16/2014   TRIG 122 09/16/2014   CHOLHDL 3.6 09/16/2014    GAD- doing well on lower dose of the Wellbutrin. Tremor seems to have improved on the lower dose. Uses 1mg  clonazepam BID PRN. Refills provided today.  Strep  pharyngitis - will given PCN injection 1.2 million units.    HTN - well controlled.  Conitnue curent regimen. Will need f/u in 6 months.   Hypogonadism-doing well on his AndroGel therapy. Due to repeat testosterone soon just to make sure that his levels are not supratherapeutic. PSA is normal. We'll need to get records transferred once he establishes care with a new primary care provider in [place].

## 2014-11-19 ENCOUNTER — Other Ambulatory Visit: Payer: Self-pay | Admitting: *Deleted

## 2014-11-19 MED ORDER — FINASTERIDE 5 MG PO TABS: 5.0000 mg | ORAL_TABLET | Freq: Every day | ORAL | Status: AC

## 2016-01-15 ENCOUNTER — Encounter: Payer: Self-pay | Admitting: Gastroenterology
